# Patient Record
Sex: Female | Born: 1970
Health system: Southern US, Community
[De-identification: ages and names within clinical notes are randomized; demographics above are authoritative.]

## PROBLEM LIST (undated history)

## (undated) DIAGNOSIS — E119 Type 2 diabetes mellitus without complications: Secondary | ICD-10-CM

## (undated) DIAGNOSIS — E785 Hyperlipidemia, unspecified: Secondary | ICD-10-CM

## (undated) DIAGNOSIS — R51 Headache: Secondary | ICD-10-CM

## (undated) DIAGNOSIS — D219 Benign neoplasm of connective and other soft tissue, unspecified: Secondary | ICD-10-CM

## (undated) DIAGNOSIS — D649 Anemia, unspecified: Secondary | ICD-10-CM

## (undated) DIAGNOSIS — E669 Obesity, unspecified: Secondary | ICD-10-CM

## (undated) DIAGNOSIS — I10 Essential (primary) hypertension: Secondary | ICD-10-CM

## (undated) DIAGNOSIS — R011 Cardiac murmur, unspecified: Secondary | ICD-10-CM

## (undated) DIAGNOSIS — Z9289 Personal history of other medical treatment: Secondary | ICD-10-CM

## (undated) HISTORY — PX: TUBAL LIGATION: SHX77

## (undated) HISTORY — DX: Cardiac murmur, unspecified: R01.1

## (undated) HISTORY — DX: Obesity, unspecified: E66.9

## (undated) HISTORY — DX: Hyperlipidemia, unspecified: E78.5

---

## 1998-07-11 ENCOUNTER — Encounter: Payer: Self-pay | Admitting: *Deleted

## 1998-07-11 ENCOUNTER — Emergency Department (HOSPITAL_COMMUNITY): Admission: EM | Admit: 1998-07-11 | Discharge: 1998-07-11 | Payer: Self-pay | Admitting: *Deleted

## 1998-07-25 ENCOUNTER — Emergency Department (HOSPITAL_COMMUNITY): Admission: EM | Admit: 1998-07-25 | Discharge: 1998-07-25 | Payer: Self-pay | Admitting: Emergency Medicine

## 1998-07-25 ENCOUNTER — Encounter: Payer: Self-pay | Admitting: Emergency Medicine

## 1998-10-22 ENCOUNTER — Other Ambulatory Visit: Admission: RE | Admit: 1998-10-22 | Discharge: 1998-10-22 | Payer: Self-pay | Admitting: Obstetrics & Gynecology

## 1999-03-25 ENCOUNTER — Inpatient Hospital Stay (HOSPITAL_COMMUNITY): Admission: AD | Admit: 1999-03-25 | Discharge: 1999-03-27 | Payer: Self-pay | Admitting: Obstetrics & Gynecology

## 2000-09-04 ENCOUNTER — Emergency Department (HOSPITAL_COMMUNITY): Admission: EM | Admit: 2000-09-04 | Discharge: 2000-09-04 | Payer: Self-pay | Admitting: *Deleted

## 2003-01-04 HISTORY — PX: SALPINGECTOMY: SHX328

## 2003-09-09 ENCOUNTER — Ambulatory Visit: Payer: Self-pay | Admitting: Family Medicine

## 2003-11-16 ENCOUNTER — Emergency Department (HOSPITAL_COMMUNITY): Admission: EM | Admit: 2003-11-16 | Discharge: 2003-11-16 | Payer: Self-pay | Admitting: Emergency Medicine

## 2004-01-14 ENCOUNTER — Emergency Department (HOSPITAL_COMMUNITY): Admission: EM | Admit: 2004-01-14 | Discharge: 2004-01-14 | Payer: Self-pay | Admitting: Family Medicine

## 2004-10-16 ENCOUNTER — Ambulatory Visit: Payer: Self-pay | Admitting: Obstetrics & Gynecology

## 2004-10-16 ENCOUNTER — Inpatient Hospital Stay (HOSPITAL_COMMUNITY): Admission: AD | Admit: 2004-10-16 | Discharge: 2004-10-18 | Payer: Self-pay | Admitting: Obstetrics & Gynecology

## 2004-10-16 ENCOUNTER — Encounter: Payer: Self-pay | Admitting: Emergency Medicine

## 2004-10-17 ENCOUNTER — Encounter (INDEPENDENT_AMBULATORY_CARE_PROVIDER_SITE_OTHER): Payer: Self-pay | Admitting: Specialist

## 2004-10-22 ENCOUNTER — Ambulatory Visit: Payer: Self-pay | Admitting: *Deleted

## 2006-02-04 ENCOUNTER — Emergency Department (HOSPITAL_COMMUNITY): Admission: EM | Admit: 2006-02-04 | Discharge: 2006-02-04 | Payer: Self-pay | Admitting: Emergency Medicine

## 2006-03-02 DIAGNOSIS — Z6841 Body Mass Index (BMI) 40.0 and over, adult: Secondary | ICD-10-CM

## 2006-03-02 DIAGNOSIS — G43909 Migraine, unspecified, not intractable, without status migrainosus: Secondary | ICD-10-CM | POA: Insufficient documentation

## 2007-02-26 ENCOUNTER — Emergency Department (HOSPITAL_COMMUNITY): Admission: EM | Admit: 2007-02-26 | Discharge: 2007-02-26 | Payer: Self-pay | Admitting: Family Medicine

## 2008-05-24 ENCOUNTER — Emergency Department (HOSPITAL_COMMUNITY): Admission: EM | Admit: 2008-05-24 | Discharge: 2008-05-24 | Payer: Self-pay | Admitting: Family Medicine

## 2008-08-15 ENCOUNTER — Emergency Department (HOSPITAL_COMMUNITY): Admission: EM | Admit: 2008-08-15 | Discharge: 2008-08-15 | Payer: Self-pay | Admitting: Emergency Medicine

## 2009-05-13 ENCOUNTER — Emergency Department (HOSPITAL_COMMUNITY): Admission: EM | Admit: 2009-05-13 | Discharge: 2009-05-13 | Payer: Self-pay | Admitting: Emergency Medicine

## 2009-05-16 ENCOUNTER — Emergency Department (HOSPITAL_COMMUNITY): Admission: EM | Admit: 2009-05-16 | Discharge: 2009-05-16 | Payer: Self-pay | Admitting: Family Medicine

## 2009-07-31 ENCOUNTER — Emergency Department (HOSPITAL_COMMUNITY): Admission: EM | Admit: 2009-07-31 | Discharge: 2009-07-31 | Payer: Self-pay | Admitting: Emergency Medicine

## 2010-03-20 LAB — URINALYSIS, ROUTINE W REFLEX MICROSCOPIC
Hgb urine dipstick: NEGATIVE
Protein, ur: NEGATIVE mg/dL
Urobilinogen, UA: 0.2 mg/dL (ref 0.0–1.0)

## 2010-03-20 LAB — BASIC METABOLIC PANEL
Calcium: 9.2 mg/dL (ref 8.4–10.5)
Chloride: 107 mEq/L (ref 96–112)
Creatinine, Ser: 0.65 mg/dL (ref 0.4–1.2)
GFR calc Af Amer: >60 mL/min (ref >60)
GFR calc non Af Amer: >60 mL/min (ref >60)

## 2010-03-20 LAB — CBC
Platelets: 347 10*3/uL (ref 150–400)
RBC: 3.97 MIL/uL (ref 3.87–5.11)
WBC: 5.5 10*3/uL (ref 4.0–10.5)

## 2010-03-20 LAB — DIFFERENTIAL
Lymphocytes Relative: 35 % (ref 12–46)
Lymphs Abs: 2 10*3/uL (ref 0.7–4.0)
Neutrophils Relative %: 53 % (ref 43–77)

## 2010-03-20 LAB — WET PREP, GENITAL

## 2010-03-20 LAB — GC/CHLAMYDIA PROBE AMP, GENITAL
Chlamydia, DNA Probe: NEGATIVE
GC Probe Amp, Genital: NEGATIVE

## 2010-03-20 LAB — URINE MICROSCOPIC-ADD ON

## 2010-03-20 LAB — GLUCOSE, CAPILLARY

## 2010-03-20 LAB — PREGNANCY, URINE: Preg Test, Ur: NEGATIVE

## 2010-04-13 LAB — POCT I-STAT, CHEM 8
BUN: 9 mg/dL (ref 6–23)
Creatinine, Ser: 0.7 mg/dL (ref 0.4–1.2)
Glucose, Bld: 213 mg/dL — ABNORMAL HIGH (ref 70–99)
Hemoglobin: 13.3 g/dL (ref 12.0–15.0)
Potassium: 3.8 mEq/L (ref 3.5–5.1)
TCO2: 27 mmol/L (ref 0–100)

## 2010-05-21 NOTE — Discharge Summary (Signed)
[name], [name]                ACCOUNT NO.:  [id number]   MEDICAL RECORD NO.:  [id number]          PATIENT TYPE:  INP   LOCATION:  9319                          FACILITY:  [facility]   PHYSICIAN:  Lesly Dukes, M.D. DATE OF BIRTH:  [birth year]   DATE OF ADMISSION:  10/16/2004  DATE OF DISCHARGE:  10/18/2004                                 DISCHARGE SUMMARY   ADMISSION DIAGNOSIS:  Torsion of ovarian dermoid cyst.   DISCHARGE DIAGNOSES:  Status post exploratory laparotomy and right salpingo-  oophorectomy for right dermoid cyst torsion.   LABORATORY AND X-RAY DATA:  Ultrasound: Right ovarian adnexal mass  consistent with dermoid.   CT could not definitely identify normal peripheral ovarian tissue, Doppler  signal.  CT scan showed a 10 x 7 x 13 cm ovarian keratoma.   Beta HCG negative.  Hemoglobin 10.1 and postoperatively 9.6.   HOSPITAL COURSE:  The patient is a 40 year old who presented with new onset  severe abdominal pain.  The patient was seen by Dr. Henderson Cloud and diagnosed  with ovarian dermoid possible torsion.  The patient was admitted by Dr.  Henderson Cloud and placed on PCA and transferred to Dr. Bertram Denver care in the  morning.  The patient was then taken to the operating room where she  underwent exploratory laparotomy and right salpingo-oophorectomy.  The  patient tolerated the procedure well, and there were no complications.  Please see operative report for full details.   By postop day #1, the patient was voiding, ambulating, and passing gas.  Her  pain was also very well controlled.  She was determined to be stable for  discharge.  Please note that during her hospitalization her blood pressure  ranged with a systolic of 130 to 156 and diastolic of 80 to 98.  She is to  follow this up with her primary care physician.   DISPOSITION:  Home.   DISCHARGE MEDICATIONS:  1.  Percocet 1 or 2 p.o. q.4-6 h.  2.  Colace 100 mg 1 tablet p.o. twice daily p.r.n.   DISCHARGE  INSTRUCTIONS:  1.  The patient is to return if she develops fever, chills, sweats,      increased pain, or bleeding.  2.  Follow up in the gynecology clinic on October 22, 2004, for staple      removal.  3.  Diet: Regular.  4.  Activity: No heavy lifting until notified by physician.     ______________________________  Marc Morgans. Mayford Knife, M.D.    ______________________________  Lesly Dukes, M.D.    TLW/MEDQ  D:  10/18/2004  T:  10/18/2004  Job:  [id number]

## 2010-05-21 NOTE — Op Note (Signed)
NAMEANNABELLE, [name]                ACCOUNT NO.:  [id number]   MEDICAL RECORD NO.:  [id number]          PATIENT TYPE:  INP   LOCATION:  9319                          [facility]  [facility]   PHYSICIAN:  Lesly Dukes, M.D. 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   OPERATION/PROCEDURE:  1.  Exploratory laparotomy.  2.  Right salpingo-oophorectomy.   SURGEON:  Lesly Dukes, M.D.   ASSISTANT:  Guy Sandifer. Henderson Cloud, M.D.   ANESTHESIA:  General.   PATHOLOGY:  Right ovary, right ovarian dermoid and fallopian tube.   ESTIMATED BLOOD LOSS:  Minimal.   COMPLICATIONS:  None.   FINDINGS:  A 10 x 13 mm ovarian dermoid that was consistent with torsion.  Normal appendix. Grossly normal uterus with a small anterior fibroid.  Left  fallopian tube surgically transected after BTL.  Small simple ovarian cyst  of left ovary __________  consistent with pain.   DESCRIPTION OF PROCEDURE:  The patient was taken to the operating room where  general anesthesia was induced.  The patient was placed in the dorsal  lithotomy position and Betadine was placed.  The patient was prepped and  draped in the normal sterile fashion.  Foley was in the bladder.  A  Pfannenstiel skin incision was made with the scalpel and carried down to the  fascia.  The fascia was incised in the midline.  The fascia was tented  bilaterally.  Superior and inferior aspects of the fascial incision were  grasped with clamp, tented up, __________  rectus muscles were separated in  the midline.  Peritoneum was identified and entered bluntly.  This incision  was extended both superiorly and inferiorly.   __________  bladder.  Balfour  retractor was placed in the abdomen and the right adnexa was brought into  surgical view.  The right ovary was torsed and necrotic after identification  of the ureter, the ovarian pedicle was doubly clamped with a Heaney clamp,  transected and suture ligated with 0 Vicryl x2.  Good hemostasis was noted.  Survey of the abdominal cavity revealed the above findings.  The ovarian  pedicle was noted to be hemostatic one last time.  The fascia was closed  with 0 Vicryl in a running fashion.  Good hemostasis was noted.  Was noted.  Incision was copiously irrigated and found to be hemostatic.  Skin was closed with staples.  The patient tolerated the procedure well.  Sponge, lab, instrument, and needle counts were x2. The patient went to the  recovery room in satisfactory condition.           ______________________________  Lesly Dukes, M.D.     KHL/MEDQ  D:  10/17/2004  T:  10/17/2004  Job:  [id number]

## 2010-07-11 ENCOUNTER — Inpatient Hospital Stay (INDEPENDENT_AMBULATORY_CARE_PROVIDER_SITE_OTHER)
Admission: RE | Admit: 2010-07-11 | Discharge: 2010-07-11 | Disposition: A | Discharge status: Home / Self Care | Payer: Self-pay | Source: Ambulatory Visit | Attending: Emergency Medicine | Admitting: Emergency Medicine

## 2010-07-11 DIAGNOSIS — S335XXA Sprain of ligaments of lumbar spine, initial encounter: Secondary | ICD-10-CM

## 2010-07-11 DIAGNOSIS — R112 Nausea with vomiting, unspecified: Secondary | ICD-10-CM

## 2010-07-11 DIAGNOSIS — R070 Pain in throat: Secondary | ICD-10-CM

## 2010-07-11 LAB — POCT URINALYSIS DIP (DEVICE)
Hgb urine dipstick: NEGATIVE
Ketones, ur: NEGATIVE mg/dL
Leukocytes, UA: NEGATIVE
Protein, ur: NEGATIVE mg/dL
pH: 5 (ref 5.0–8.0)

## 2013-02-15 ENCOUNTER — Other Ambulatory Visit: Payer: Self-pay

## 2013-02-15 DIAGNOSIS — Z1231 Encounter for screening mammogram for malignant neoplasm of breast: Secondary | ICD-10-CM

## 2013-02-21 ENCOUNTER — Ambulatory Visit
Admission: RE | Admit: 2013-02-21 | Discharge: 2013-02-21 | Disposition: A | Discharge status: Home / Self Care | Payer: BC Managed Care – PPO | Source: Ambulatory Visit

## 2013-02-21 ENCOUNTER — Ambulatory Visit: Payer: Self-pay

## 2013-02-21 DIAGNOSIS — Z1231 Encounter for screening mammogram for malignant neoplasm of breast: Secondary | ICD-10-CM

## 2013-02-25 ENCOUNTER — Other Ambulatory Visit: Payer: Self-pay | Admitting: Internal Medicine

## 2013-02-25 DIAGNOSIS — R928 Other abnormal and inconclusive findings on diagnostic imaging of breast: Secondary | ICD-10-CM

## 2013-02-26 ENCOUNTER — Other Ambulatory Visit: Payer: Self-pay | Admitting: Internal Medicine

## 2013-02-26 ENCOUNTER — Other Ambulatory Visit: Payer: Self-pay

## 2013-02-26 DIAGNOSIS — R928 Other abnormal and inconclusive findings on diagnostic imaging of breast: Secondary | ICD-10-CM

## 2013-02-28 ENCOUNTER — Other Ambulatory Visit: Payer: BC Managed Care – PPO

## 2013-03-03 DIAGNOSIS — Z9289 Personal history of other medical treatment: Secondary | ICD-10-CM

## 2013-03-03 HISTORY — DX: Personal history of other medical treatment: Z92.89

## 2013-03-04 ENCOUNTER — Ambulatory Visit
Admission: RE | Admit: 2013-03-04 | Discharge: 2013-03-04 | Disposition: A | Discharge status: Home / Self Care | Payer: BC Managed Care – PPO | Source: Ambulatory Visit | Attending: Internal Medicine | Admitting: Internal Medicine

## 2013-03-04 DIAGNOSIS — R928 Other abnormal and inconclusive findings on diagnostic imaging of breast: Secondary | ICD-10-CM

## 2013-03-15 ENCOUNTER — Ambulatory Visit: Payer: Self-pay | Admitting: Certified Nurse Midwife

## 2013-03-20 ENCOUNTER — Observation Stay (HOSPITAL_COMMUNITY)
Admission: AD | Admit: 2013-03-20 | Discharge: 2013-03-21 | Disposition: A | Discharge status: Home / Self Care | Payer: BC Managed Care – PPO | Source: Ambulatory Visit | Attending: Obstetrics and Gynecology | Admitting: Obstetrics and Gynecology

## 2013-03-20 ENCOUNTER — Encounter (HOSPITAL_COMMUNITY): Payer: Self-pay | Admitting: General Practice

## 2013-03-20 DIAGNOSIS — R42 Dizziness and giddiness: Secondary | ICD-10-CM | POA: Insufficient documentation

## 2013-03-20 DIAGNOSIS — N920 Excessive and frequent menstruation with regular cycle: Principal | ICD-10-CM | POA: Insufficient documentation

## 2013-03-20 DIAGNOSIS — E119 Type 2 diabetes mellitus without complications: Secondary | ICD-10-CM | POA: Insufficient documentation

## 2013-03-20 DIAGNOSIS — E669 Obesity, unspecified: Secondary | ICD-10-CM | POA: Insufficient documentation

## 2013-03-20 DIAGNOSIS — D649 Anemia, unspecified: Secondary | ICD-10-CM | POA: Insufficient documentation

## 2013-03-20 DIAGNOSIS — I10 Essential (primary) hypertension: Secondary | ICD-10-CM | POA: Insufficient documentation

## 2013-03-20 HISTORY — DX: Essential (primary) hypertension: I10

## 2013-03-20 HISTORY — DX: Benign neoplasm of connective and other soft tissue, unspecified: D21.9

## 2013-03-20 HISTORY — DX: Anemia, unspecified: D64.9

## 2013-03-20 HISTORY — DX: Type 2 diabetes mellitus without complications: E11.9

## 2013-03-20 LAB — CBC
HCT: 25 % — ABNORMAL LOW (ref 36.0–46.0)
HEMOGLOBIN: 7.7 g/dL — AB (ref 12.0–15.0)
MCH: 22.3 pg — ABNORMAL LOW (ref 26.0–34.0)
MCHC: 30.8 g/dL (ref 30.0–36.0)
MCV: 72.3 fL — ABNORMAL LOW (ref 78.0–100.0)
Platelets: 330 10*3/uL (ref 150–400)
RBC: 3.46 MIL/uL — ABNORMAL LOW (ref 3.87–5.11)
RDW: 24.1 % — AB (ref 11.5–15.5)
WBC: 8.1 10*3/uL (ref 4.0–10.5)

## 2013-03-20 LAB — PREGNANCY, URINE: Preg Test, Ur: NEGATIVE

## 2013-03-20 LAB — ABO/RH: ABO/RH(D): A POS

## 2013-03-20 LAB — PREPARE RBC (CROSSMATCH)

## 2013-03-20 MED ORDER — MEDROXYPROGESTERONE ACETATE 10 MG PO TABS
10.0000 mg | ORAL_TABLET | Freq: Every day | ORAL | Status: DC
  Administered 2013-03-20 – 2013-03-21 (×2): 10 mg via ORAL
  Filled 2013-03-20 (×3): qty 1

## 2013-03-20 MED ORDER — SODIUM CHLORIDE 0.9 % IV SOLN
INTRAVENOUS | Status: DC
  Administered 2013-03-20 – 2013-03-21 (×3): via INTRAVENOUS

## 2013-03-20 MED ORDER — IBUPROFEN 600 MG PO TABS: 600.0000 mg | ORAL_TABLET | Freq: Four times a day (QID) | ORAL | Status: DC | PRN

## 2013-03-20 MED ORDER — KETOROLAC TROMETHAMINE 60 MG/2ML IM SOLN
60.0000 mg | Freq: Once | INTRAMUSCULAR | Status: AC
  Administered 2013-03-20: 60 mg via INTRAMUSCULAR
  Filled 2013-03-20: qty 2

## 2013-03-20 MED ORDER — FERROUS SULFATE 325 (65 FE) MG PO TABS
325.0000 mg | ORAL_TABLET | Freq: Two times a day (BID) | ORAL | Status: DC
  Administered 2013-03-20 – 2013-03-21 (×2): 325 mg via ORAL
  Filled 2013-03-20 (×4): qty 1

## 2013-03-20 NOTE — MAU Provider Note (Signed)
History     CSN: [id number]  Arrival date and time: 03/20/13 1050   None     Chief Complaint  Patient presents with  . Vaginal Bleeding  . Dizziness  . Shortness of Breath   HPI Pt presents with heavy VB and dizziness. Pt started her period yesterday. She has soaked a pad q 45 minutes.  She denies having any bleeding disorders.  Her periods have been heavy over the last year.    OB History   Grav Para Term Preterm Abortions TAB SAB Ect Mult Living   3 3        3       Past Medical History  Diagnosis Date  . Fibroids   . Diabetes mellitus without complication   . Hypertension   . Anemia     Past Surgical History  Procedure Laterality Date  . Salpingectomy Right 2005    History reviewed. No pertinent family history.  History  Substance Use Topics  . Smoking status: Never Smoker   . Smokeless tobacco: Never Used  . Alcohol Use: Yes     Comment: very occasional    Allergies: No Known Allergies  Prescriptions prior to admission  Medication Sig Dispense Refill  . Feeding Supplies (ENFAMIL SLOW-FLOW NIPPLE) MISC 3 capsules by Does not apply route 2 (two) times daily.      . ferrous sulfate 325 (65 FE) MG tablet Take 325 mg by mouth daily with breakfast.      . ibuprofen (ADVIL,MOTRIN) 200 MG tablet Take 200 mg by mouth every 6 (six) hours as needed.      Marland Kitchen lisinopril-hydrochlorothiazide (PRINZIDE,ZESTORETIC) 10-12.5 MG per tablet Take 1 tablet by mouth daily.      . metFORMIN (GLUCOPHAGE) 500 MG tablet Take 500 mg by mouth 2 (two) times daily with a meal.        ROS Physical Exam   Blood pressure 77/51, pulse 91, temperature 97.9 F (36.6 C), temperature source Oral, resp. rate 20, height 5\' 3"  (1.6 m), weight 107.684 kg (237 lb 6.4 oz), last menstrual period 03/19/2013, SpO2 100.00%.  Physical Exam Physical Examination: General appearance - alert, well appearing, and in no distress Chest - clear to auscultation, no wheezes, rales or rhonchi, symmetric air  entry Heart - normal rate and regular rhythm Abdomen - soft, nontender, nondistended, no masses or organomegaly Pelvic - normal external genitalia, vulva, vagina, cervix, uterus and adnexa, large clots and heavy bleeding seen Extremities - peripheral pulses normal, no pedal edema, no clubbing or cyanosis   MAU Course  Procedures  MDM Recent Results (from the past 2160 hour(s))  CBC     Status: Abnormal   Collection Time    03/20/13 12:50 PM      Result Value Ref Range   WBC 8.1  4.0 - 10.5 K/uL   RBC 3.46 (*) 3.87 - 5.11 MIL/uL   Hemoglobin 7.7 (*) 12.0 - 15.0 g/dL   HCT 25.0 (*) 36.0 - 46.0 %   MCV 72.3 (*) 78.0 - 100.0 fL   MCH 22.3 (*) 26.0 - 34.0 pg   MCHC 30.8  30.0 - 36.0 g/dL   RDW 24.1 (*) 11.5 - 15.5 %   Platelets 330  150 - 400 K/uL  TYPE AND SCREEN     Status: None   Collection Time    03/20/13 12:50 PM      Result Value Ref Range   ABO/RH(D) A POS     Antibody Screen NEG  Sample Expiration 03/23/2013      Assessment and Plan  Menorrhagia with symptomatic anemia Will place in observation and give blood because she is symptomatic Provera and toradol to help decrease the amount of bleeding Plan to fu in the office for [facility] and [facility] Pt agrees with the plan  [place] A 03/20/2013, 3:01 PM

## 2013-03-20 NOTE — MAU Note (Signed)
Pt has hx of fibroids.

## 2013-03-20 NOTE — MAU Note (Signed)
Period started yesterday, bleeding heavily, soaking super pads every hour.  Feeling lightheaded & SOB.  Lower abd cramping & pain @ her cervix.

## 2013-03-21 DIAGNOSIS — N920 Excessive and frequent menstruation with regular cycle: Secondary | ICD-10-CM | POA: Diagnosis present

## 2013-03-21 LAB — CBC
HEMATOCRIT: 23.9 % — AB (ref 36.0–46.0)
HEMOGLOBIN: 7.7 g/dL — AB (ref 12.0–15.0)
MCH: 24.4 pg — ABNORMAL LOW (ref 26.0–34.0)
MCHC: 32.2 g/dL (ref 30.0–36.0)
MCV: 75.6 fL — ABNORMAL LOW (ref 78.0–100.0)
Platelets: 223 10*3/uL (ref 150–400)
RBC: 3.16 MIL/uL — AB (ref 3.87–5.11)
RDW: 21.3 % — ABNORMAL HIGH (ref 11.5–15.5)
WBC: 11.9 10*3/uL — AB (ref 4.0–10.5)

## 2013-03-21 LAB — TYPE AND SCREEN
ABO/RH(D): A POS
ANTIBODY SCREEN: NEGATIVE
UNIT DIVISION: 0
Unit division: 0

## 2013-03-21 MED ORDER — MEDROXYPROGESTERONE ACETATE 10 MG PO TABS: 10.0000 mg | ORAL_TABLET | Freq: Every day | ORAL | Status: DC

## 2013-03-21 NOTE — Progress Notes (Signed)
Patient changed pad x 1 since 7:30pm with scant amount vaginal drainage noted.  No clots.

## 2013-03-21 NOTE — Discharge Instructions (Signed)
Menorrhagia Menorrhagia is the medical term for when your menstrual periods are heavy or last longer than usual. With menorrhagia, every period you have may cause enough blood loss and cramping that you are unable to maintain your usual activities. CAUSES  In some cases, the cause of heavy periods is unknown, but a number of conditions may cause menorrhagia. Common causes include:  A problem with the hormone-producing thyroid gland (hypothyroid).  Noncancerous growths in the uterus (polyps or fibroids).  An imbalance of the estrogen and progesterone hormones.  One of your ovaries not releasing an egg during one or more months.  Side effects of having an intrauterine device (IUD).  Side effects of some medicines, such as anti-inflammatory medicines or blood thinners.  A bleeding disorder that stops your blood from clotting normally. SIGNS AND SYMPTOMS  During a normal period, bleeding lasts between 4 and 8 days. Signs that your periods are too heavy include:  You routinely have to change your pad or tampon every 1 or 2 hours because it is completely soaked.  You pass blood clots larger than 1 inch (2.5 cm) in size.  You have bleeding for more than 7 days.  You need to use pads and tampons at the same time because of heavy bleeding.  You need to wake up to change your pads or tampons during the night.  You have symptoms of anemia, such as tiredness, fatigue, or shortness of breath. DIAGNOSIS  Your health care provider will perform a physical exam and ask you questions about your symptoms and menstrual history. Other tests may be ordered based on what the health care provider finds during the exam. These tests can include:  Blood tests To check if you are pregnant or have hormonal changes, a bleeding or thyroid disorder, low iron levels (anemia), or other problems.  Endometrial biopsy Your health care provider takes a sample of tissue from the inside of your uterus to be examined  under a microscope.  Pelvic ultrasound This test uses sound waves to make a picture of your uterus, ovaries, and vagina. The pictures can show if you have fibroids or other growths.  Hysteroscopy For this test, your health care provider will use a small telescope to look inside your uterus. Based on the results of your initial tests, your health care provider may recommend further testing. TREATMENT  Treatment may not be needed. If it is needed, your health care provider may recommend treatment with one or more medicines first. If these do not reduce bleeding enough, a surgical treatment might be an option. The best treatment for you will depend on:   Whether you need to prevent pregnancy.  Your desire to have children in the future.  The cause and severity of your bleeding.  Your opinion and personal preference.  Medicines for menorrhagia may include:  Birth control methods that use hormones These include the pill, skin patch, vaginal ring, shots that you get every 3 months, hormonal IUD, and implant. These treatments reduce bleeding during your menstrual period.  Medicines that thicken blood and slow bleeding.  Medicines that reduce swelling, such as ibuprofen.  Medicines that contain a synthetic hormone called progestin.   Medicines that make the ovaries stop working for a short time.  You may need surgical treatment for menorrhagia if the medicines are unsuccessful. Treatment options include:  Dilation and curettage (D&C) In this procedure, your health care provider opens (dilates) your cervix and then scrapes or suctions tissue from the lining of your   uterus to reduce menstrual bleeding.  Operative hysteroscopy This procedure uses a tiny tube with a light (hysteroscope) to view your uterine cavity and can help in the surgical removal of a polyp that may be causing heavy periods.  Endometrial ablation Through various techniques, your health care provider permanently  destroys the entire lining of your uterus (endometrium). After endometrial ablation, most women have little or no menstrual flow. Endometrial ablation reduces your ability to become pregnant.  Endometrial resection This surgical procedure uses an electrosurgical wire loop to remove the lining of the uterus. This procedure also reduces your ability to become pregnant.  Hysterectomy Surgical removal of the uterus and cervix is a permanent procedure that stops menstrual periods. Pregnancy is not possible after a hysterectomy. This procedure requires anesthesia and hospitalization. HOME CARE INSTRUCTIONS   Only take over-the-counter or prescription medicines as directed by your health care provider. Take prescribed medicines exactly as directed. Do not change or switch medicines without consulting your health care provider.  Take any prescribed iron pills exactly as directed by your health care provider. Long-term heavy bleeding may result in low iron levels. Iron pills help replace the iron your body lost from heavy bleeding. Iron may cause constipation. If this becomes a problem, increase the bran, fruits, and roughage in your diet.  Do not take aspirin or medicines that contain aspirin 1 week before or during your menstrual period. Aspirin may make the bleeding worse.  If you need to change your sanitary pad or tampon more than once every 2 hours, stay in bed and rest as much as possible until the bleeding stops.  Eat well-balanced meals. Eat foods high in iron. Examples are leafy green vegetables, meat, liver, eggs, and whole grain breads and cereals. Do not try to lose weight until the abnormal bleeding has stopped and your blood iron level is back to normal. SEEK MEDICAL CARE IF:   You soak through a pad or tampon every 1 or 2 hours, and this happens every time you have a period.  You need to use pads and tampons at the same time because you are bleeding so much.  You need to change your pad  or tampon during the night.  You have a period that lasts for more than 8 days.  You pass clots bigger than 1 inch wide.  You have irregular periods that happen more or less often than once a month.  You feel dizzy or faint.  You feel very weak or tired.  You feel short of breath or feel your heart is beating too fast when you exercise.  You have nausea and vomiting or diarrhea while you are taking your medicine.  You have any problems that may be related to the medicine you are taking. SEEK IMMEDIATE MEDICAL CARE IF:   You soak through 4 or more pads or tampons in 2 hours.  You have any bleeding while you are pregnant. MAKE SURE YOU:   Understand these instructions.  Will watch your condition.  Will get help right away if you are not doing well or get worse. Document Released: 12/20/2004 Document Revised: 10/10/2012 Document Reviewed: 06/10/2012 [facility] Patient Information 2014 [place]. Anemia, Nonspecific Anemia is a condition in which the concentration of red blood cells or hemoglobin in the blood is below normal. Hemoglobin is a substance in red blood cells that carries oxygen to the tissues of the body. Anemia results in not enough oxygen reaching these tissues.  CAUSES  Common causes of  anemia include:   Excessive bleeding. Bleeding may be internal or external. This includes excessive bleeding from periods (in women) or from the intestine.   Poor nutrition.   Chronic kidney, thyroid, and liver disease.  Bone marrow disorders that decrease red blood cell production.  Cancer and treatments for cancer.  HIV, AIDS, and their treatments.  Spleen problems that increase red blood cell destruction.  Blood disorders.  Excess destruction of red blood cells due to infection, medicines, and autoimmune disorders. SIGNS AND SYMPTOMS   Minor weakness.   Dizziness.   Headache.  Palpitations.   Shortness of breath, especially with exercise.    Paleness.  Cold sensitivity.  Indigestion.  Nausea.  Difficulty sleeping.  Difficulty concentrating. Symptoms may occur suddenly or they may develop slowly.  DIAGNOSIS  Additional blood tests are often needed. These help your health care provider determine the best treatment. Your health care provider will check your stool for blood and look for other causes of blood loss.  TREATMENT  Treatment varies depending on the cause of the anemia. Treatment can include:   Supplements of iron, vitamin T55, or folic acid.   Hormone medicines.   A blood transfusion. This may be needed if blood loss is severe.   Hospitalization. This may be needed if there is significant continual blood loss.   Dietary changes.  Spleen removal. HOME CARE INSTRUCTIONS Keep all follow-up appointments. It often takes many weeks to correct anemia, and having your health care provider check on your condition and your response to treatment is very important. SEEK IMMEDIATE MEDICAL CARE IF:   You develop extreme weakness, shortness of breath, or chest pain.   You become dizzy or have trouble concentrating.  You develop heavy vaginal bleeding.   You develop a rash.   You have bloody or black, tarry stools.   You faint.   You vomit up blood.   You vomit repeatedly.   You have abdominal pain.  You have a fever or persistent symptoms for more than 2 3 days.   You have a fever and your symptoms suddenly get worse.   You are dehydrated.  MAKE SURE YOU:  Understand these instructions.  Will watch your condition.  Will get help right away if you are not doing well or get worse. Document Released: 01/28/2004 Document Revised: 08/22/2012 Document Reviewed: 06/15/2012 [facility] Patient Information 2014 [place].

## 2013-03-21 NOTE — Discharge Summary (Signed)
Physician Discharge Summary  Patient ID: [name] MRN: [id number] DOB/AGE: 45-Sep-1972 45 y.o.  Admit date:         03/20/2013 Discharge date: 03/21/2013  Admission Diagnoses:  Menorrhagia  Dizziness  Anemia  Diabetes  Hypertension  Obesity  Discharge Diagnoses:   Same  Procedures this Admission:  03/20/2013  Blood transfusion  Discharged Condition:   good   Admission Hx and PE:  The patient has been followed at the [facility]. She has a history of  fibroids, anemia, and menorrhagia. Her menstrual cycle started on 03/19/2013. Her bleeding has been heavier than usual. The patient complains of dizziness.  Please see her documented history and physical exam.   Hospital course:  On the day of admission, the patient underwent the following: Blood transfusion. She was given Provera and Toradol. She was given boluses of IV fluid. She felt much better after her treatment. On the day of discharge the patient was able to ambulate without difficulty. She tolerated a regular diet. Today she was felt to be ready for discharge. Her hemoglobin was noted to be 7.7 on admission.  Labs:  Results for orders placed during the hospital encounter of 03/20/13 (from the past 24 hour(s))  PREPARE RBC (CROSSMATCH)     Status: None   Collection Time    03/20/13  2:58 PM      Result Value Ref Range   Order Confirmation ORDER PROCESSED BY BLOOD BANK    PREGNANCY, URINE     Status: None   Collection Time    03/20/13 10:50 PM      Result Value Ref Range   Preg Test, Ur NEGATIVE  NEGATIVE  CBC     Status: Abnormal   Collection Time    03/21/13  2:15 AM      Result Value Ref Range   WBC 11.9 (*) 4.0 - 10.5 K/uL   RBC 3.16 (*) 3.87 - 5.11 MIL/uL   Hemoglobin 7.7 (*) 12.0 - 15.0 g/dL   HCT 23.9 (*) 36.0 - 46.0 %   MCV 75.6 (*) 78.0 - 100.0 fL   MCH 24.4 (*) 26.0 - 34.0 pg   MCHC 32.2  30.0 - 36.0 g/dL   RDW 21.3 (*) 11.5 - 15.5  %   Platelets 223  150 - 400 K/uL     Hemoglobin  Date Value Ref Range Status  03/21/2013 7.7* 12.0 - 15.0 g/dL Final     HCT  Date Value Ref Range Status  03/21/2013 23.9* 36.0 - 46.0 % Final    Consults: None  Disposition:  The patient will be discharged to home. She has been given a copy of the discharge instructions as prepared by the [facility] for patients who have menorrhagia and anemia .      Medication List    STOP taking these medications       ENFAMIL SLOW-FLOW NIPPLE Misc      TAKE these medications       ferrous sulfate 325 (65 FE) MG tablet  Take 325 mg by mouth daily with breakfast.     ibuprofen 200 MG tablet  Commonly known as:  ADVIL,MOTRIN  Take 200 mg by mouth every 6 (six) hours as needed.     lisinopril-hydrochlorothiazide 10-12.5 MG per tablet  Commonly known as:  PRINZIDE,ZESTORETIC  Take 1 tablet by mouth daily.     medroxyPROGESTERone 10 MG tablet  Commonly known as:  PROVERA  Take 1 tablet (10 mg total) by mouth daily.     metFORMIN 500 MG tablet  Commonly known as:  GLUCOPHAGE  Take 500 mg by mouth 2 (two) times daily with a meal.           Follow-up Information   Follow up with [facility] A, MD In 1 week.   Specialty:  Obstetrics and Gynecology   Contact information:   [address] [place] [address] [phone]       Signed: Eli Hose 03/21/2013, 2:49 PM

## 2013-03-21 NOTE — Progress Notes (Signed)
Pt discharged home with son... Condition stable... No equipment... Ambulated to car with E. Mandrell Vangilder, RN.  

## 2013-03-22 NOTE — H&P (Signed)
CSN: [id number]  Arrival date and time: 03/20/13 1050  None  Chief Complaint   Patient presents with   .  Vaginal Bleeding   .  Dizziness   .  Shortness of Breath    HPI Pt presents with heavy VB and dizziness. Pt started her period yesterday. She has soaked a pad q 45 minutes. She denies having any bleeding disorders. Her periods have been heavy over the last year.  OB History    Grav  Para  Term  Preterm  Abortions  TAB  SAB  Ect  Mult  Living    3  3         3       Past Medical History   Diagnosis  Date   .  Fibroids    .  Diabetes mellitus without complication    .  Hypertension    .  Anemia     Past Surgical History   Procedure  Laterality  Date   .  Salpingectomy  Right  2005    History reviewed. No pertinent family history.  History   Substance Use Topics   .  Smoking status:  Never Smoker   .  Smokeless tobacco:  Never Used   .  Alcohol Use:  Yes      Comment: very occasional    Allergies: No Known Allergies  Prescriptions prior to admission   Medication  Sig  Dispense  Refill   .  Feeding Supplies (ENFAMIL SLOW-FLOW NIPPLE) MISC  3 capsules by Does not apply route 2 (two) times daily.     .  ferrous sulfate 325 (65 FE) MG tablet  Take 325 mg by mouth daily with breakfast.     .  ibuprofen (ADVIL,MOTRIN) 200 MG tablet  Take 200 mg by mouth every 6 (six) hours as needed.     Marland Kitchen  lisinopril-hydrochlorothiazide (PRINZIDE,ZESTORETIC) 10-12.5 MG per tablet  Take 1 tablet by mouth daily.     .  metFORMIN (GLUCOPHAGE) 500 MG tablet  Take 500 mg by mouth 2 (two) times daily with a meal.      ROS  Physical Exam   Blood pressure 77/51, pulse 91, temperature 97.9 F (36.6 C), temperature source Oral, resp. rate 20, height 5\' 3"  (1.6 m), weight 107.684 kg (237 lb 6.4 oz), last menstrual period 03/19/2013, SpO2 100.00%.  Physical Exam  Physical Examination: General appearance - alert, well appearing, and in no distress  Chest - clear to auscultation, no wheezes, rales or  rhonchi, symmetric air entry  Heart - normal rate and regular rhythm  Abdomen - soft, nontender, nondistended, no masses or organomegaly  Pelvic - normal external genitalia, vulva, vagina, cervix, uterus and adnexa, large clots and heavy bleeding seen  Extremities - peripheral pulses normal, no pedal edema, no clubbing or cyanosis  MAU Course   Procedures  MDM  Recent Results (from the past 2160 hour(s))   CBC Status: Abnormal    Collection Time    03/20/13 12:50 PM   Result  Value  Ref Range    WBC  8.1  4.0 - 10.5 K/uL    RBC  3.46 (*)  3.87 - 5.11 MIL/uL    Hemoglobin  7.7 (*)  12.0 - 15.0 g/dL    HCT  25.0 (*)  36.0 - 46.0 %    MCV  72.3 (*)  78.0 - 100.0 fL    MCH  22.3 (*)  26.0 - 34.0 pg    MCHC  30.8  30.0 - 36.0 g/dL    RDW  24.1 (*)  11.5 - 15.5 %    Platelets  330  150 - 400 K/uL   TYPE AND SCREEN Status: None    Collection Time    03/20/13 12:50 PM   Result  Value  Ref Range    ABO/RH(D)  A POS     Antibody Screen  NEG     Sample Expiration  03/23/2013     Assessment and Plan   Menorrhagia with symptomatic anemia  Will place in observation and give blood because she is symptomatic  Provera and toradol to help decrease the amount of bleeding  Plan to fu in the office for [facility] and [facility]  Pt agrees with the plan  [place] A  03/20/2013, 3:01 PM

## 2013-05-03 ENCOUNTER — Other Ambulatory Visit: Payer: Self-pay | Admitting: Obstetrics and Gynecology

## 2013-05-08 ENCOUNTER — Other Ambulatory Visit: Payer: Self-pay

## 2013-05-08 ENCOUNTER — Other Ambulatory Visit (HOSPITAL_COMMUNITY): Payer: Self-pay | Admitting: Obstetrics and Gynecology

## 2013-05-08 NOTE — H&P (Signed)
[name] is a 43 y.o.  female P 3-0-0-3 presents for hysterectomy because of menorrhagia, anemia  and fibroids.  For the past several years the patient has had increasing length and volume of her menses but fortunately no cramping.  Her menstrual flow lasts for 7-12 days, accompanied by clots requiring her to change her pad every 30-45 minutes.  She denies any changes in urinary/bowel function and no dyspareunia or vaginitis symptoms.  In March 2015 she was transfused 2 units of packed red blood cells due to symptomatic  anemia attributed to her menorrhagia.  Her hemoglobin/hematocrit was 7.7/25.0 respectively though her TSH was normal and gonorrhea/chlyamydia tests negative.   A SHG in March 2015 showed: uterus: 8.29 x 6.64 x 6.59 cm with multiple fibroids;   #3 intramural fibroids- 2.6 x 2.8 x 2.4 cm;   2.9 x 3.0 x 2.8 cm with a submucosal component and 2.9 x 2.9 x 2.7 cm.  The saline infusion showed a sub-mucosal fibroid in the lower 1/3 of endometrial cavity 2.7 c 1.8 cm.   Since her transfusion in March, attempts were made to control her bleeding with Provera 20 mg daily that initially worked but then failed.  She was subsequently placed on Aygestin 10 mg daily that also curtailed her bleeding initially, however, the effect was short-lived.   A review of both medical and surgical management options were given to the patient however, given the debilitating nature of her symptoms, along with radiographic findings,  she has decided to proceed with definitive therapy in the form of hysterectomy.   Past Medical History  OB History: G3;  P 3-0-03;   SVB 1989, 1991 and 2001   GYN History: menarche: 43 YO;     LMP: 04/30/13;     Contracepton: Tubal Sterilization; Admits to Herpes Simplex 2 but  denies history of abnormal PAP smear;   Last PAP smear: 2015  Medical History: Hypertension, Diabetes Mellitus, Anemia, Menstrual Headaches  Surgical History:  2001  Tubal Sterilzation   2005 Right  Salpingo-oophorectomy (for ovarian cyst)   2014 Left Eye Chalzion Removal Denies problems with anesthesia;  was transfused 2 units of packed red blood cells March 2015  Family History: Hypertension and Diabetes Mellitus  Social History: Divorced and employed as a Psychologist, sport and exercise;  Denies tobacco use but occasionally uses alcohol   Medications:  Metformin  500 mg daily Aygestin 5 mg  2 daily FeSO4  325 mg 2 daily Lisinopril 10 mg  daily   Denies sensitivity to peanuts, shellfish, soy, latex or adhesives.   No Known Allergies  ROS: Admits to menstrual headaches;   Denies  vision changes, nasal congestion, dysphagia, tinnitus, dizziness, hoarseness, cough,  chest pain, shortness of breath, nausea, vomiting, diarrhea,constipation,  urinary frequency, urgency  dysuria, hematuria, vaginitis symptoms, pelvic pain, swelling of joints,easy bruising,  myalgias, arthralgias, skin rashes, unexplained weight loss and except as is mentioned in the history of present illness, patient's review of systems is otherwise negative.   Physical Exam  Bp: 122/84   P: 60    R: 15   Temperature: 98.5 degrees F orally   Weight: 240 lbs.  Height: 5\' 3"   BMI: 42.5  Neck: supple without masses or thyromegaly Lungs: clear to auscultation Heart: regular rate and rhythm Abdomen: soft, non-tender and no organomegaly Pelvic:EGBUS- wnl; vagina-normal rugae, large blood in vault;  uterus-appears normal size though exam limited by habitus, cervix without lesions or motion tenderness though exam limited by habitus;  adnexae-no tenderness  or masses Extremities:  no clubbing, cyanosis or edema  Endometrial Biopsy-abundant blood and benign weakly proliferative endometrium, no atypia, hyperplasia or malignancy.  Assesment:  Menorrhagia            Anemia            Submucosal Fibroid   Disposition:  A discussion was held with patient regarding the indication for her procedure(s) along with the risks, which include  but are not limited to: reaction to anesthesia, damage to adjacent organs, infection and excessive bleeding.  The patient was given a Miralax bowel prep to be completed the day before her surgery. She  verbalized understanding of her preoperative instructions and the risks associated with her surgery and has consented to proceed with a Total Laparoscopic Hysterectomy with Bilateral Salpingectomy, Possible Laparoscopically Assisted Vaginal Hysterectomy, Possible Total Abdominal Hysterectomy and Cystoscopy at [facility] on May 22, 2013 at 1:30 p.m.   CSN# [id number]   Maicy Filip J. Florene Glen, PA-C  for Dr. Franklyn Lor. Dillard

## 2013-05-09 ENCOUNTER — Encounter (HOSPITAL_COMMUNITY): Payer: Self-pay

## 2013-05-16 NOTE — Patient Instructions (Addendum)
   Your procedure is scheduled on:  Wednesday, May 20  Enter through the [employer] [facility] at: [place] up the phone at the desk and dial [phone] and inform us of your arrival.  Please call this number if you have any problems the morning of surgery: [phone]  Remember: Do not eat food after midnight: Tuesday Do not drink clear liquids after: 9 AM Wednesday, day of surgery Take these medicines the morning of surgery with a SIP OF WATER: lisinopril-hctz.  Patient instructed to withhold Tuesday's night dose and Wednesday morning dose of metformin.  We will check your blood sugar when you arrive in Short Stay on day of surgery.  Do not wear jewelry, make-up, or FINGER nail polish No metal in your hair or on your body. Do not wear lotions, powders, perfumes.  You may wear deodorant.  Do not bring valuables to the hospital. Contacts, dentures or bridgework may not be worn into surgery.  Leave suitcase in the car. After Surgery it may be brought to your room. For patients being admitted to the hospital, checkout time is 11:00am the day of discharge.  Home with sister [name] cell [phone].

## 2013-05-17 ENCOUNTER — Encounter (HOSPITAL_COMMUNITY): Payer: Self-pay

## 2013-05-17 ENCOUNTER — Encounter (HOSPITAL_COMMUNITY)
Admission: RE | Admit: 2013-05-17 | Discharge: 2013-05-17 | Disposition: A | Discharge status: Home / Self Care | Payer: BC Managed Care – PPO | Source: Ambulatory Visit | Attending: Obstetrics and Gynecology | Admitting: Obstetrics and Gynecology

## 2013-05-17 DIAGNOSIS — Z01812 Encounter for preprocedural laboratory examination: Secondary | ICD-10-CM | POA: Insufficient documentation

## 2013-05-17 DIAGNOSIS — Z01810 Encounter for preprocedural cardiovascular examination: Secondary | ICD-10-CM | POA: Insufficient documentation

## 2013-05-17 HISTORY — DX: Personal history of other medical treatment: Z92.89

## 2013-05-17 HISTORY — DX: Headache: R51

## 2013-05-17 LAB — CBC
HCT: 26.2 % — ABNORMAL LOW (ref 36.0–46.0)
HEMOGLOBIN: 7.9 g/dL — AB (ref 12.0–15.0)
MCH: 25 pg — ABNORMAL LOW (ref 26.0–34.0)
MCHC: 30.2 g/dL (ref 30.0–36.0)
MCV: 82.9 fL (ref 78.0–100.0)
PLATELETS: 454 10*3/uL — AB (ref 150–400)
RBC: 3.16 MIL/uL — AB (ref 3.87–5.11)
RDW: 17.6 % — ABNORMAL HIGH (ref 11.5–15.5)
WBC: 10.5 10*3/uL (ref 4.0–10.5)

## 2013-05-17 LAB — COMPREHENSIVE METABOLIC PANEL
ALT: 9 U/L (ref 0–35)
AST: 8 U/L (ref 0–37)
Albumin: 3.5 g/dL (ref 3.5–5.2)
Alkaline Phosphatase: 52 U/L (ref 39–117)
BUN: 10 mg/dL (ref 6–23)
CHLORIDE: 98 meq/L (ref 96–112)
CO2: 24 meq/L (ref 19–32)
CREATININE: 0.69 mg/dL (ref 0.50–1.10)
Calcium: 9.3 mg/dL (ref 8.4–10.5)
GFR calc Af Amer: >90 mL/min (ref >90)
GFR calc non Af Amer: >90 mL/min (ref >90)
Glucose, Bld: 304 mg/dL — ABNORMAL HIGH (ref 70–99)
Potassium: 4.3 mEq/L (ref 3.7–5.3)
Sodium: 133 mEq/L — ABNORMAL LOW (ref 137–147)
Total Protein: 7.3 g/dL (ref 6.0–8.3)

## 2013-05-17 LAB — TYPE AND SCREEN
ABO/RH(D): A POS
Antibody Screen: NEGATIVE

## 2013-05-17 NOTE — Pre-Procedure Instructions (Signed)
Dr Primitivo Gauze informed hgb 7.9 and glucose 304 at PAT appt today.  MD has ordered 2 units of RBCs.  T & S done today.  Patient is on iron.  Dr Primitivo Gauze said patient needs to follow up with PCP to get glucose level down below 300 on day of surgery.  Patient and MD informed.  Patient states she will follow up with PCP today.

## 2013-05-17 NOTE — Pre-Procedure Instructions (Signed)
SDS BB History Log given to Lab for patient's history of blood transfusion in 03/2013 at [facility] - 2 units transfused.

## 2013-05-18 ENCOUNTER — Emergency Department (HOSPITAL_COMMUNITY)
Admission: EM | Admit: 2013-05-18 | Discharge: 2013-05-18 | Disposition: A | Discharge status: Home / Self Care | Payer: BC Managed Care – PPO | Source: Home / Self Care | Attending: Emergency Medicine | Admitting: Emergency Medicine

## 2013-05-18 ENCOUNTER — Encounter (HOSPITAL_COMMUNITY): Payer: Self-pay | Admitting: Emergency Medicine

## 2013-05-18 DIAGNOSIS — R739 Hyperglycemia, unspecified: Secondary | ICD-10-CM

## 2013-05-18 DIAGNOSIS — R7309 Other abnormal glucose: Secondary | ICD-10-CM

## 2013-05-18 LAB — POCT URINALYSIS DIP (DEVICE)
BILIRUBIN URINE: NEGATIVE
Glucose, UA: >=1000 mg/dL — AB
KETONES UR: NEGATIVE mg/dL
Leukocytes, UA: NEGATIVE
Nitrite: NEGATIVE
PH: 6 (ref 5.0–8.0)
Protein, ur: 30 mg/dL — AB
SPECIFIC GRAVITY, URINE: 1.02 (ref 1.005–1.030)
Urobilinogen, UA: 0.2 mg/dL (ref 0.0–1.0)

## 2013-05-18 LAB — POCT I-STAT, CHEM 8
BUN: 9 mg/dL (ref 6–23)
CREATININE: 0.7 mg/dL (ref 0.50–1.10)
Calcium, Ion: 1.22 mmol/L (ref 1.12–1.23)
Chloride: 99 mEq/L (ref 96–112)
Glucose, Bld: 315 mg/dL — ABNORMAL HIGH (ref 70–99)
HCT: 30 % — ABNORMAL LOW (ref 36.0–46.0)
Hemoglobin: 10.2 g/dL — ABNORMAL LOW (ref 12.0–15.0)
POTASSIUM: 4.4 meq/L (ref 3.7–5.3)
SODIUM: 134 meq/L — AB (ref 137–147)
TCO2: 23 mmol/L (ref 0–100)

## 2013-05-18 MED ORDER — METFORMIN HCL 850 MG PO TABS: 850.0000 mg | ORAL_TABLET | Freq: Two times a day (BID) | ORAL | Status: DC

## 2013-05-18 MED ORDER — ONDANSETRON 4 MG PO TBDP
ORAL_TABLET | ORAL | Status: AC
  Filled 2013-05-18: qty 1

## 2013-05-18 MED ORDER — ONDANSETRON 4 MG PO TBDP
4.0000 mg | ORAL_TABLET | Freq: Once | ORAL | Status: AC
  Administered 2013-05-18: 4 mg via ORAL

## 2013-05-18 NOTE — ED Provider Notes (Signed)
CSN: [id number]     Arrival date & time 05/18/13  4098 History   First MD Initiated Contact with Patient 05/18/13 1001     Chief Complaint  Patient presents with  . Hyperglycemia   (Consider location/radiation/quality/duration/timing/severity/associated sxs/prior Treatment) HPI Comments: 43 year old female with history of type 2 diabetes, iron deficiency anemia, presents complaining of hyperglycemia and nausea. This was noted on her preop labs done yesterday for hysterectomy. She was told to followup with primary care. She called and spoke with her primary care physician, but their office never called her back. She checked her blood sugar and it was even higher at 324 this morning. She believes that this may be due to her Provera and her norethindrone that were both started recently. She currently takes metformin 500 mg twice a day.she is a has nausea with no vomiting or abdominal pain. Denies polyuria or polydipsia.   Patient is a 43 y.o. female presenting with hyperglycemia.  Hyperglycemia Associated symptoms: nausea     Past Medical History  Diagnosis Date  . Fibroids   . Hypertension   . Anemia   . SVD (spontaneous vaginal delivery)     x 3  . Diabetes mellitus without complication     type 2  . Headache(784.0)     otc med prn  . History of blood transfusion 03/2013    [facility] - 2 units transfued   Past Surgical History  Procedure Laterality Date  . Salpingectomy Right 2005    adb insicion  . Tubal ligation     No family history on file. History  Substance Use Topics  . Smoking status: Never Smoker   . Smokeless tobacco: Never Used  . Alcohol Use: Yes     Comment: occasional   OB History   Grav Para Term Preterm Abortions TAB SAB Ect Mult Living   3 3        3      Review of Systems  Gastrointestinal: Positive for nausea.  Endocrine:       Hyperglycemia  All other systems reviewed and are negative.   Allergies  Review of patient's allergies indicates no known  allergies.  Home Medications   Prior to Admission medications   Medication Sig Start Date End Date Taking? Authorizing Provider  ferrous sulfate 325 (65 FE) MG tablet Take 325 mg by mouth 2 (two) times daily with a meal.    Yes Historical Provider, MD  lisinopril-hydrochlorothiazide (PRINZIDE,ZESTORETIC) 10-12.5 MG per tablet Take 1 tablet by mouth daily.   Yes Historical Provider, MD  metFORMIN (GLUCOPHAGE) 500 MG tablet Take 500 mg by mouth 2 (two) times daily with a meal.   Yes Historical Provider, MD  norethindrone (AYGESTIN) 5 MG tablet Take 10-15 mg by mouth daily. Takes 10mg  daily and if still bleeding takes another 5mg  in afternoon.   Yes Historical Provider, MD  ibuprofen (ADVIL,MOTRIN) 200 MG tablet Take 400 mg by mouth 2 (two) times daily as needed for headache or cramping.     Historical Provider, MD   BP 113/77  Pulse 105  Temp(Src) 98.2 F (36.8 C) (Oral)  Resp 21  SpO2 99% Physical Exam  Nursing note and vitals reviewed. Constitutional: She is oriented to person, place, and time. Vital signs are normal. She appears well-developed and well-nourished. No distress.  HENT:  Head: Normocephalic and atraumatic.  Right Ear: External ear normal.  Left Ear: External ear normal.  Nose: Nose normal.  Mouth/Throat: Oropharynx is clear and moist.  Eyes: Conjunctivae are  normal. Right eye exhibits no discharge. Left eye exhibits no discharge.  Neck: Normal range of motion. Neck supple. No thyromegaly present.  Cardiovascular: Normal rate, regular rhythm and normal pulses.  Exam reveals no gallop and no friction rub.   Murmur heard.  Decrescendo systolic murmur is present with a grade of 3/6  Pulmonary/Chest: Effort normal and breath sounds normal. No respiratory distress.  Abdominal: Soft. Bowel sounds are normal. She exhibits no distension and no mass. There is no tenderness. There is no rebound and no guarding.  Neurological: She is alert and oriented to person, place, and time.  She has normal strength. Coordination normal.  Skin: Skin is warm and dry. No rash noted. She is not diaphoretic.  Psychiatric: She has a normal mood and affect. Judgment normal.    ED Course  Procedures (including critical care time) Labs Review Labs Reviewed  POCT I-STAT, CHEM 8 - Abnormal; Notable for the following:    Sodium 134 (*)    Glucose, Bld 315 (*)    Hemoglobin 10.2 (*)    HCT 30.0 (*)    All other components within normal limits  POCT URINALYSIS DIP (DEVICE) - Abnormal; Notable for the following:    Glucose, UA >=1000 (*)    Hgb urine dipstick LARGE (*)    Protein, ur 30 (*)    All other components within normal limits    Imaging Review No results found.   MDM   1. Hyperglycemia    Will increase metformin to 850 twice a day instead of 500 twice a day. She will followup with primary care on Monday.   Meds ordered this encounter  Medications  . ondansetron (ZOFRAN-ODT) disintegrating tablet 4 mg    Sig:   . metFORMIN (GLUCOPHAGE) 850 MG tablet    Sig: Take 1 tablet (850 mg total) by mouth 2 (two) times daily with a meal.    Dispense:  14 tablet    Refill:  0    Order Specific Question:  Supervising Provider    Answer:  Jake Michaelis, DAVID C [[id number]]     Liam Graham, PA-C 05/18/13 1050

## 2013-05-18 NOTE — ED Notes (Signed)
Pt c/o sugar levels being high This am, fasting, sugar was 324 Pt states she went to pre-op for hysterectomy appt yest and sugar was 300 Needs to have DM controlled before op.  Alert w/no signs of acute distress.

## 2013-05-18 NOTE — Discharge Instructions (Signed)
I am increasing your metformin to 850 mg twice daily. If the metformin you have at home is a scored tablet that can be cut in half easily, you may take 1-1/2 of your 500 mg tablets twice daily instead of this new prescription. Please followup with your primary care physician on Monday for a recheck.    Hyperglycemia Hyperglycemia occurs when the glucose (sugar) in your blood is too high. Hyperglycemia can happen for many reasons, but it most often happens to people who do not know they have diabetes or are not managing their diabetes properly.  CAUSES  Whether you have diabetes or not, there are other causes of hyperglycemia. Hyperglycemia can occur when you have diabetes, but it can also occur in other situations that you might not be as aware of, such as: Diabetes  If you have diabetes and are having problems controlling your blood glucose, hyperglycemia could occur because of some of the following reasons:  Not following your meal plan.  Not taking your diabetes medications or not taking it properly.  Exercising less or doing less activity than you normally do.  Being sick. Pre-diabetes  This cannot be ignored. Before people develop Type 2 diabetes, they almost always have "pre-diabetes." This is when your blood glucose levels are higher than normal, but not yet high enough to be diagnosed as diabetes. Research has shown that some long-term damage to the body, especially the heart and circulatory system, may already be occurring during pre-diabetes. If you take action to manage your blood glucose when you have pre-diabetes, you may delay or prevent Type 2 diabetes from developing. Stress  If you have diabetes, you may be "diet" controlled or on oral medications or insulin to control your diabetes. However, you may find that your blood glucose is higher than usual in the hospital whether you have diabetes or not. This is often referred to as "stress hyperglycemia." Stress can elevate your  blood glucose. This happens because of hormones put out by the body during times of stress. If stress has been the cause of your high blood glucose, it can be followed regularly by your caregiver. That way he/she can make sure your hyperglycemia does not continue to get worse or progress to diabetes. Steroids  Steroids are medications that act on the infection fighting system (immune system) to block inflammation or infection. One side effect can be a rise in blood glucose. Most people can produce enough extra insulin to allow for this rise, but for those who cannot, steroids make blood glucose levels go even higher. It is not unusual for steroid treatments to "uncover" diabetes that is developing. It is not always possible to determine if the hyperglycemia will go away after the steroids are stopped. A special blood test called an A1c is sometimes done to determine if your blood glucose was elevated before the steroids were started. SYMPTOMS  Thirsty.  Frequent urination.  Dry mouth.  Blurred vision.  Tired or fatigue.  Weakness.  Sleepy.  Tingling in feet or leg. DIAGNOSIS  Diagnosis is made by monitoring blood glucose in one or all of the following ways:  A1c test. This is a chemical found in your blood.  Fingerstick blood glucose monitoring.  Laboratory results. TREATMENT  First, knowing the cause of the hyperglycemia is important before the hyperglycemia can be treated. Treatment may include, but is not be limited to:  Education.  Change or adjustment in medications.  Change or adjustment in meal plan.  Treatment for an  illness, infection, etc.  More frequent blood glucose monitoring.  Change in exercise plan.  Decreasing or stopping steroids.  Lifestyle changes. HOME CARE INSTRUCTIONS   Test your blood glucose as directed.  Exercise regularly. Your caregiver will give you instructions about exercise. Pre-diabetes or diabetes which comes on with stress is  helped by exercising.  Eat wholesome, balanced meals. Eat often and at regular, fixed times. Your caregiver or nutritionist will give you a meal plan to guide your sugar intake.  Being at an ideal weight is important. If needed, losing as little as 10 to 15 pounds may help improve blood glucose levels. SEEK MEDICAL CARE IF:   You have questions about medicine, activity, or diet.  You continue to have symptoms (problems such as increased thirst, urination, or weight gain). SEEK IMMEDIATE MEDICAL CARE IF:   You are vomiting or have diarrhea.  Your breath smells fruity.  You are breathing faster or slower.  You are very sleepy or incoherent.  You have numbness, tingling, or pain in your feet or hands.  You have chest pain.  Your symptoms get worse even though you have been following your caregiver's orders.  If you have any other questions or concerns. Document Released: 06/15/2000 Document Revised: 03/14/2011 Document Reviewed: 04/18/2011 [facility] Patient Information 2014 [place], [place].

## 2013-05-20 NOTE — ED Provider Notes (Signed)
Medical screening examination/treatment/procedure(s) were performed by non-physician practitioner and as supervising physician I was immediately available for consultation/collaboration.  Philipp Deputy, M.D.  Harden Mo, MD 05/20/13 [phone]

## 2013-05-22 ENCOUNTER — Encounter (HOSPITAL_COMMUNITY)
Admission: RE | Disposition: A | Discharge status: Home / Self Care | Payer: Self-pay | Source: Ambulatory Visit | Attending: Obstetrics and Gynecology

## 2013-05-22 ENCOUNTER — Inpatient Hospital Stay (HOSPITAL_COMMUNITY)
Admission: RE | Admit: 2013-05-22 | Discharge: 2013-05-25 | DRG: 742 | Disposition: A | Discharge status: Home / Self Care | Payer: BC Managed Care – PPO | Source: Ambulatory Visit | Attending: Obstetrics and Gynecology | Admitting: Obstetrics and Gynecology

## 2013-05-22 ENCOUNTER — Ambulatory Visit (HOSPITAL_COMMUNITY): Payer: BC Managed Care – PPO | Admitting: Anesthesiology

## 2013-05-22 ENCOUNTER — Encounter (HOSPITAL_COMMUNITY): Payer: BC Managed Care – PPO | Admitting: Anesthesiology

## 2013-05-22 DIAGNOSIS — IMO0002 Reserved for concepts with insufficient information to code with codable children: Secondary | ICD-10-CM

## 2013-05-22 DIAGNOSIS — Y9210 Unspecified residential institution as the place of occurrence of the external cause: Secondary | ICD-10-CM | POA: Diagnosis not present

## 2013-05-22 DIAGNOSIS — E119 Type 2 diabetes mellitus without complications: Secondary | ICD-10-CM | POA: Diagnosis present

## 2013-05-22 DIAGNOSIS — Z6841 Body Mass Index (BMI) 40.0 and over, adult: Secondary | ICD-10-CM

## 2013-05-22 DIAGNOSIS — D649 Anemia, unspecified: Secondary | ICD-10-CM | POA: Diagnosis present

## 2013-05-22 DIAGNOSIS — D251 Intramural leiomyoma of uterus: Secondary | ICD-10-CM | POA: Diagnosis present

## 2013-05-22 DIAGNOSIS — Z90710 Acquired absence of both cervix and uterus: Secondary | ICD-10-CM | POA: Diagnosis present

## 2013-05-22 DIAGNOSIS — I10 Essential (primary) hypertension: Secondary | ICD-10-CM | POA: Diagnosis present

## 2013-05-22 DIAGNOSIS — N920 Excessive and frequent menstruation with regular cycle: Principal | ICD-10-CM | POA: Diagnosis present

## 2013-05-22 DIAGNOSIS — D252 Subserosal leiomyoma of uterus: Secondary | ICD-10-CM | POA: Diagnosis present

## 2013-05-22 DIAGNOSIS — D250 Submucous leiomyoma of uterus: Secondary | ICD-10-CM | POA: Diagnosis present

## 2013-05-22 HISTORY — PX: ABDOMINAL HYSTERECTOMY: SHX81

## 2013-05-22 HISTORY — PX: CYSTOSCOPY: SHX5120

## 2013-05-22 HISTORY — PX: LAPAROSCOPY: SHX197

## 2013-05-22 HISTORY — PX: LAPAROTOMY: SHX154

## 2013-05-22 LAB — CBC
HEMATOCRIT: 21.3 % — AB (ref 36.0–46.0)
Hemoglobin: 6.5 g/dL — CL (ref 12.0–15.0)
MCH: 24.1 pg — AB (ref 26.0–34.0)
MCHC: 30.5 g/dL (ref 30.0–36.0)
MCV: 78.9 fL (ref 78.0–100.0)
Platelets: 421 10*3/uL — ABNORMAL HIGH (ref 150–400)
RBC: 2.7 MIL/uL — ABNORMAL LOW (ref 3.87–5.11)
RDW: 18 % — AB (ref 11.5–15.5)
WBC: 12.9 10*3/uL — ABNORMAL HIGH (ref 4.0–10.5)

## 2013-05-22 LAB — PREGNANCY, URINE: Preg Test, Ur: NEGATIVE

## 2013-05-22 LAB — GLUCOSE, CAPILLARY
GLUCOSE-CAPILLARY: 199 mg/dL — AB (ref 70–99)
GLUCOSE-CAPILLARY: 190 mg/dL — AB (ref 70–99)
Glucose-Capillary: 140 mg/dL — ABNORMAL HIGH (ref 70–99)
Glucose-Capillary: 220 mg/dL — ABNORMAL HIGH (ref 70–99)
Glucose-Capillary: 255 mg/dL — ABNORMAL HIGH (ref 70–99)

## 2013-05-22 LAB — PREPARE RBC (CROSSMATCH)

## 2013-05-22 SURGERY — LAPAROSCOPY OPERATIVE
Anesthesia: General | Site: Bladder

## 2013-05-22 MED ORDER — IBUPROFEN 600 MG PO TABS
600.0000 mg | ORAL_TABLET | Freq: Four times a day (QID) | ORAL | Status: DC | PRN
  Administered 2013-05-24 – 2013-05-25 (×3): 600 mg via ORAL
  Filled 2013-05-22 (×3): qty 1

## 2013-05-22 MED ORDER — STERILE WATER FOR IRRIGATION IR SOLN
Status: DC | PRN
  Administered 2013-05-22: 1000 mL

## 2013-05-22 MED ORDER — HYDROMORPHONE HCL PF 1 MG/ML IJ SOLN
INTRAMUSCULAR | Status: AC
  Filled 2013-05-22: qty 1

## 2013-05-22 MED ORDER — INSULIN ASPART 100 UNIT/ML ~~LOC~~ SOLN
0.0000 [IU] | Freq: Three times a day (TID) | SUBCUTANEOUS | Status: DC
  Administered 2013-05-23 – 2013-05-24 (×4): 3 [IU] via SUBCUTANEOUS
  Administered 2013-05-24: 2 [IU] via SUBCUTANEOUS
  Administered 2013-05-25: 3 [IU] via SUBCUTANEOUS
  Administered 2013-05-25: 12:00:00 via SUBCUTANEOUS

## 2013-05-22 MED ORDER — HYDROMORPHONE HCL PF 1 MG/ML IJ SOLN
0.2500 mg | INTRAMUSCULAR | Status: DC | PRN
  Administered 2013-05-22 (×4): 0.5 mg via INTRAVENOUS

## 2013-05-22 MED ORDER — MENTHOL 3 MG MT LOZG: 1.0000 | LOZENGE | OROMUCOSAL | Status: DC | PRN

## 2013-05-22 MED ORDER — DIPHENHYDRAMINE HCL 50 MG/ML IJ SOLN: 12.5000 mg | Freq: Four times a day (QID) | INTRAMUSCULAR | Status: DC | PRN

## 2013-05-22 MED ORDER — PROPOFOL 10 MG/ML IV EMUL
INTRAVENOUS | Status: AC
  Filled 2013-05-22: qty 20

## 2013-05-22 MED ORDER — STERILE WATER FOR IRRIGATION IR SOLN
Status: DC | PRN
Start: 2013-05-22 — End: 2013-05-22
  Administered 2013-05-22: 80 mL

## 2013-05-22 MED ORDER — CEFAZOLIN SODIUM-DEXTROSE 2-3 GM-% IV SOLR
INTRAVENOUS | Status: AC
  Administered 2013-05-22: 2 g via INTRAVENOUS
  Filled 2013-05-22: qty 50

## 2013-05-22 MED ORDER — HYDROMORPHONE HCL PF 1 MG/ML IJ SOLN
INTRAMUSCULAR | Status: DC | PRN
  Administered 2013-05-22 (×4): 0.5 mg via INTRAVENOUS

## 2013-05-22 MED ORDER — LIDOCAINE HCL (CARDIAC) 20 MG/ML IV SOLN
INTRAVENOUS | Status: AC
  Filled 2013-05-22: qty 5

## 2013-05-22 MED ORDER — NEOSTIGMINE METHYLSULFATE 10 MG/10ML IV SOLN
INTRAVENOUS | Status: DC | PRN
  Administered 2013-05-22: 3 mg via INTRAVENOUS

## 2013-05-22 MED ORDER — KETOROLAC TROMETHAMINE 30 MG/ML IJ SOLN
30.0000 mg | Freq: Four times a day (QID) | INTRAMUSCULAR | Status: DC
  Administered 2013-05-22 – 2013-05-24 (×6): 30 mg via INTRAVENOUS
  Filled 2013-05-22 (×6): qty 1

## 2013-05-22 MED ORDER — KETOROLAC TROMETHAMINE 30 MG/ML IJ SOLN: 30.0000 mg | Freq: Four times a day (QID) | INTRAMUSCULAR | Status: DC

## 2013-05-22 MED ORDER — PHENYLEPHRINE 40 MCG/ML (10ML) SYRINGE FOR IV PUSH (FOR BLOOD PRESSURE SUPPORT)
PREFILLED_SYRINGE | INTRAVENOUS | Status: AC
  Filled 2013-05-22: qty 5

## 2013-05-22 MED ORDER — MIDAZOLAM HCL 2 MG/2ML IJ SOLN
INTRAMUSCULAR | Status: AC
  Filled 2013-05-22: qty 2

## 2013-05-22 MED ORDER — PHENYLEPHRINE HCL 10 MG/ML IJ SOLN
INTRAMUSCULAR | Status: DC | PRN
  Administered 2013-05-22 (×5): 40 ug via INTRAVENOUS

## 2013-05-22 MED ORDER — FENTANYL CITRATE 0.05 MG/ML IJ SOLN
INTRAMUSCULAR | Status: AC
  Filled 2013-05-22: qty 5

## 2013-05-22 MED ORDER — PHENYLEPHRINE HCL 10 MG/ML IJ SOLN
INTRAMUSCULAR | Status: AC
  Filled 2013-05-22: qty 1

## 2013-05-22 MED ORDER — CEFAZOLIN SODIUM-DEXTROSE 2-3 GM-% IV SOLR
2.0000 g | Freq: Once | INTRAVENOUS | Status: AC
  Administered 2013-05-22: 2 g via INTRAVENOUS
  Filled 2013-05-22: qty 50

## 2013-05-22 MED ORDER — SODIUM CHLORIDE 0.9 % IV SOLN
INTRAVENOUS | Status: DC
  Administered 2013-05-22: 500 mL via INTRAVENOUS

## 2013-05-22 MED ORDER — HYDROCODONE-ACETAMINOPHEN 5-325 MG PO TABS
1.0000 | ORAL_TABLET | ORAL | Status: DC | PRN
  Administered 2013-05-24: 1 via ORAL
  Filled 2013-05-22: qty 1

## 2013-05-22 MED ORDER — KETOROLAC TROMETHAMINE 30 MG/ML IJ SOLN: 15.0000 mg | Freq: Once | INTRAMUSCULAR | Status: DC | PRN

## 2013-05-22 MED ORDER — FERROUS SULFATE 325 (65 FE) MG PO TABS
325.0000 mg | ORAL_TABLET | Freq: Two times a day (BID) | ORAL | Status: DC
  Administered 2013-05-23 – 2013-05-25 (×4): 325 mg via ORAL
  Filled 2013-05-22 (×4): qty 1

## 2013-05-22 MED ORDER — DOCUSATE SODIUM 100 MG PO CAPS
100.0000 mg | ORAL_CAPSULE | Freq: Three times a day (TID) | ORAL | Status: DC
  Administered 2013-05-23 – 2013-05-25 (×6): 100 mg via ORAL
  Filled 2013-05-22 (×6): qty 1

## 2013-05-22 MED ORDER — MEPERIDINE HCL 25 MG/ML IJ SOLN: 6.2500 mg | INTRAMUSCULAR | Status: DC | PRN

## 2013-05-22 MED ORDER — ROCURONIUM BROMIDE 100 MG/10ML IV SOLN
INTRAVENOUS | Status: AC
  Filled 2013-05-22: qty 1

## 2013-05-22 MED ORDER — GLYCOPYRROLATE 0.2 MG/ML IJ SOLN
INTRAMUSCULAR | Status: AC
  Filled 2013-05-22: qty 4

## 2013-05-22 MED ORDER — ONDANSETRON HCL 4 MG/2ML IJ SOLN: 4.0000 mg | Freq: Four times a day (QID) | INTRAMUSCULAR | Status: DC | PRN

## 2013-05-22 MED ORDER — CEFAZOLIN SODIUM-DEXTROSE 2-3 GM-% IV SOLR
2.0000 g | INTRAVENOUS | Status: AC
Start: 2013-05-22 — End: 2013-05-22
  Administered 2013-05-22: 2 g via INTRAVENOUS

## 2013-05-22 MED ORDER — BUPIVACAINE HCL (PF) 0.25 % IJ SOLN
INTRAMUSCULAR | Status: DC | PRN
Start: 2013-05-22 — End: 2013-05-22
  Administered 2013-05-22: 10 mL

## 2013-05-22 MED ORDER — DIPHENHYDRAMINE HCL 12.5 MG/5ML PO ELIX: 12.5000 mg | ORAL_SOLUTION | Freq: Four times a day (QID) | ORAL | Status: DC | PRN

## 2013-05-22 MED ORDER — LISINOPRIL-HYDROCHLOROTHIAZIDE 10-12.5 MG PO TABS: 1.0000 | ORAL_TABLET | Freq: Every day | ORAL | Status: DC

## 2013-05-22 MED ORDER — METHYLENE BLUE 1 % INJ SOLN
INTRAMUSCULAR | Status: AC
  Filled 2013-05-22: qty 10

## 2013-05-22 MED ORDER — PROMETHAZINE HCL 25 MG/ML IJ SOLN: 6.2500 mg | INTRAMUSCULAR | Status: DC | PRN

## 2013-05-22 MED ORDER — SODIUM CHLORIDE 0.9 % IJ SOLN
INTRAMUSCULAR | Status: AC
  Filled 2013-05-22: qty 3

## 2013-05-22 MED ORDER — LIDOCAINE HCL (CARDIAC) 20 MG/ML IV SOLN
INTRAVENOUS | Status: DC | PRN
  Administered 2013-05-22: 60 mg via INTRAVENOUS

## 2013-05-22 MED ORDER — ROCURONIUM BROMIDE 100 MG/10ML IV SOLN
INTRAVENOUS | Status: DC | PRN
  Administered 2013-05-22: 10 mg via INTRAVENOUS
  Administered 2013-05-22: 20 mg via INTRAVENOUS
  Administered 2013-05-22: 10 mg via INTRAVENOUS
  Administered 2013-05-22: 50 mg via INTRAVENOUS
  Administered 2013-05-22: 20 mg via INTRAVENOUS

## 2013-05-22 MED ORDER — PHENYLEPHRINE HCL 10 MG/ML IJ SOLN
10.0000 mg | INTRAMUSCULAR | Status: DC | PRN
  Administered 2013-05-22: 50 ug/min via INTRAVENOUS

## 2013-05-22 MED ORDER — FENTANYL CITRATE 0.05 MG/ML IJ SOLN
INTRAMUSCULAR | Status: DC | PRN
  Administered 2013-05-22: 100 ug via INTRAVENOUS
  Administered 2013-05-22: 50 ug via INTRAVENOUS
  Administered 2013-05-22: 100 ug via INTRAVENOUS

## 2013-05-22 MED ORDER — METHYLENE BLUE 1 % INJ SOLN
INTRAMUSCULAR | Status: DC | PRN
  Administered 2013-05-22: 5 mL via INTRAVENOUS

## 2013-05-22 MED ORDER — ONDANSETRON HCL 4 MG/2ML IJ SOLN
INTRAMUSCULAR | Status: DC | PRN
  Administered 2013-05-22: 4 mg via INTRAVENOUS

## 2013-05-22 MED ORDER — NEOSTIGMINE METHYLSULFATE 10 MG/10ML IV SOLN
INTRAVENOUS | Status: AC
  Filled 2013-05-22: qty 1

## 2013-05-22 MED ORDER — GLYCOPYRROLATE 0.2 MG/ML IJ SOLN
INTRAMUSCULAR | Status: DC | PRN
  Administered 2013-05-22: 0.6 mg via INTRAVENOUS

## 2013-05-22 MED ORDER — SODIUM CHLORIDE 0.9 % IJ SOLN: 9.0000 mL | INTRAMUSCULAR | Status: DC | PRN

## 2013-05-22 MED ORDER — DEXTROSE IN LACTATED RINGERS 5 % IV SOLN
INTRAVENOUS | Status: DC
  Administered 2013-05-22: 22:00:00 via INTRAVENOUS

## 2013-05-22 MED ORDER — HYDROMORPHONE 0.3 MG/ML IV SOLN
INTRAVENOUS | Status: DC
  Administered 2013-05-22: 1.2 mg via INTRAVENOUS
  Administered 2013-05-22: 21:00:00 via INTRAVENOUS
  Administered 2013-05-23 (×2): 0.6 mg via INTRAVENOUS
  Administered 2013-05-23 (×2): 0.3 mg via INTRAVENOUS
  Filled 2013-05-22: qty 25

## 2013-05-22 MED ORDER — PROPOFOL 10 MG/ML IV BOLUS
INTRAVENOUS | Status: DC | PRN
  Administered 2013-05-22: 20 mg via INTRAVENOUS
  Administered 2013-05-22: 150 mg via INTRAVENOUS
  Administered 2013-05-22: 30 mg via INTRAVENOUS

## 2013-05-22 MED ORDER — NALOXONE HCL 0.4 MG/ML IJ SOLN: 0.4000 mg | INTRAMUSCULAR | Status: DC | PRN

## 2013-05-22 MED ORDER — METFORMIN HCL 850 MG PO TABS: 850.0000 mg | ORAL_TABLET | Freq: Two times a day (BID) | ORAL | Status: DC

## 2013-05-22 MED ORDER — MIDAZOLAM HCL 2 MG/2ML IJ SOLN: 0.5000 mg | Freq: Once | INTRAMUSCULAR | Status: DC | PRN

## 2013-05-22 MED ORDER — HYDROCHLOROTHIAZIDE 12.5 MG PO CAPS
12.5000 mg | ORAL_CAPSULE | Freq: Every day | ORAL | Status: DC
  Administered 2013-05-24 – 2013-05-25 (×2): 12.5 mg via ORAL
  Filled 2013-05-22 (×4): qty 1

## 2013-05-22 MED ORDER — LACTATED RINGERS IV SOLN
INTRAVENOUS | Status: DC
  Administered 2013-05-22 (×5): via INTRAVENOUS

## 2013-05-22 MED ORDER — DEXAMETHASONE SODIUM PHOSPHATE 10 MG/ML IJ SOLN
INTRAMUSCULAR | Status: AC
  Filled 2013-05-22: qty 1

## 2013-05-22 MED ORDER — METFORMIN HCL 500 MG PO TABS
500.0000 mg | ORAL_TABLET | Freq: Two times a day (BID) | ORAL | Status: DC
  Administered 2013-05-23 – 2013-05-25 (×4): 500 mg via ORAL
  Filled 2013-05-22 (×7): qty 1

## 2013-05-22 MED ORDER — ONDANSETRON HCL 4 MG/2ML IJ SOLN
INTRAMUSCULAR | Status: AC
  Filled 2013-05-22: qty 2

## 2013-05-22 MED ORDER — LISINOPRIL 10 MG PO TABS
10.0000 mg | ORAL_TABLET | Freq: Every day | ORAL | Status: DC
  Administered 2013-05-24 – 2013-05-25 (×2): 10 mg via ORAL
  Filled 2013-05-22 (×4): qty 1

## 2013-05-22 MED ORDER — MIDAZOLAM HCL 5 MG/5ML IJ SOLN
INTRAMUSCULAR | Status: DC | PRN
  Administered 2013-05-22 (×2): 1 mg via INTRAVENOUS
  Administered 2013-05-22: 2 mg via INTRAVENOUS

## 2013-05-22 SURGICAL SUPPLY — 61 items
ADH SKN CLS APL DERMABOND .7 (GAUZE/BANDAGES/DRESSINGS) ×4
ADH SKN CLS LQ APL DERMABOND (GAUZE/BANDAGES/DRESSINGS) ×4
BARRIER ADHS 3X4 INTERCEED (GAUZE/BANDAGES/DRESSINGS) IMPLANT
BRR ADH 4X3 ABS CNTRL BYND (GAUZE/BANDAGES/DRESSINGS)
CHLORAPREP W/TINT 26ML (MISCELLANEOUS) ×5 IMPLANT
CLOTH BEACON ORANGE TIMEOUT ST (SAFETY) ×5 IMPLANT
COUNTER NEEDLE 1200 MAGNETIC (NEEDLE) ×2 IMPLANT
COVER MAYO STAND STRL (DRAPES) ×5 IMPLANT
DERMABOND ADHESIVE PROPEN (GAUZE/BANDAGES/DRESSINGS) ×1
DERMABOND ADVANCED (GAUZE/BANDAGES/DRESSINGS) ×1
DERMABOND ADVANCED .7 DNX12 (GAUZE/BANDAGES/DRESSINGS) ×4 IMPLANT
DERMABOND ADVANCED .7 DNX6 (GAUZE/BANDAGES/DRESSINGS) ×1 IMPLANT
DISSECTOR BLUNT TIP ENDO 5MM (MISCELLANEOUS) IMPLANT
DRAIN CHANNEL 10F 3/8 F FF (DRAIN) ×2 IMPLANT
DRSG OPSITE POSTOP 4X10 (GAUZE/BANDAGES/DRESSINGS) ×2 IMPLANT
EVACUATOR SILICONE 100CC (DRAIN) ×2 IMPLANT
EVACUATOR SMOKE 8.L (FILTER) ×10 IMPLANT
GAUZE SPONGE 4X4 16PLY XRAY LF (GAUZE/BANDAGES/DRESSINGS) ×2 IMPLANT
GLOVE BIO SURGEON STRL SZ 6.5 (GLOVE) ×13 IMPLANT
GLOVE BIOGEL PI IND STRL 7.0 (GLOVE) ×8 IMPLANT
GLOVE BIOGEL PI IND STRL 7.5 (GLOVE) ×4 IMPLANT
GLOVE BIOGEL PI INDICATOR 7.0 (GLOVE) ×5
GLOVE BIOGEL PI INDICATOR 7.5 (GLOVE) ×1
GOWN STRL REUS W/TWL LRG LVL3 (GOWN DISPOSABLE) ×20 IMPLANT
HEMOSTAT SURGICEL 2X14 (HEMOSTASIS) IMPLANT
NS IRRIG 1000ML POUR BTL (IV SOLUTION) ×5 IMPLANT
OCCLUDER COLPOPNEUMO (BALLOONS) ×5 IMPLANT
PACK LAPAROSCOPY BASIN (CUSTOM PROCEDURE TRAY) ×5 IMPLANT
SCISSORS LAP 5X35 DISP (ENDOMECHANICALS) ×6 IMPLANT
SET CYSTO W/LG BORE CLAMP LF (SET/KITS/TRAYS/PACK) IMPLANT
SET IRRIG TUBING LAPAROSCOPIC (IRRIGATION / IRRIGATOR) ×3 IMPLANT
SHEARS HARMONIC ACE PLUS 36CM (ENDOMECHANICALS) ×2 IMPLANT
SOLUTION ELECTROLUBE (MISCELLANEOUS) ×5 IMPLANT
SPONGE LAP 18X18 X RAY DECT (DISPOSABLE) ×6 IMPLANT
STRIP CLOSURE SKIN 1/4X4 (GAUZE/BANDAGES/DRESSINGS) IMPLANT
SUT MNCRL AB 3-0 PS2 27 (SUTURE) ×5 IMPLANT
SUT PDS AB 1 CT1 36 (SUTURE) IMPLANT
SUT SILK 3 0 SH CR/8 (SUTURE) ×4 IMPLANT
SUT VIC AB 0 CT1 18XCR BRD8 (SUTURE) ×2 IMPLANT
SUT VIC AB 0 CT1 27 (SUTURE) ×20
SUT VIC AB 0 CT1 27XBRD ANBCTR (SUTURE) ×3 IMPLANT
SUT VIC AB 0 CT1 27XCR 8 STRN (SUTURE) ×1 IMPLANT
SUT VIC AB 0 CT1 8-18 (SUTURE) ×10
SUT VIC AB 3-0 SH 27 (SUTURE) ×20
SUT VIC AB 3-0 SH 27X BRD (SUTURE) ×4 IMPLANT
SUT VICRYL 0 TIES 12 18 (SUTURE) IMPLANT
SUT VICRYL 0 UR6 27IN ABS (SUTURE) ×10 IMPLANT
SYR 50ML LL SCALE MARK (SYRINGE) ×5 IMPLANT
TIP UTERINE 5.1X6CM LAV DISP (MISCELLANEOUS) IMPLANT
TIP UTERINE 6.7X10CM GRN DISP (MISCELLANEOUS) IMPLANT
TIP UTERINE 6.7X6CM WHT DISP (MISCELLANEOUS) IMPLANT
TIP UTERINE 6.7X8CM BLUE DISP (MISCELLANEOUS) ×4 IMPLANT
TOWEL OR 17X24 6PK STRL BLUE (TOWEL DISPOSABLE) ×10 IMPLANT
TRAY FOLEY CATH 14FR (SET/KITS/TRAYS/PACK) ×5 IMPLANT
TROCAR BALLN 12MMX100 BLUNT (TROCAR) ×5 IMPLANT
TROCAR XCEL NON-BLD 11X100MML (ENDOMECHANICALS) ×5 IMPLANT
TROCAR XCEL NON-BLD 5MMX100MML (ENDOMECHANICALS) ×5 IMPLANT
TROCAR XCEL OPT SLVE 5M 100M (ENDOMECHANICALS) ×5 IMPLANT
TUBING FILTER THERMOFLATOR (ELECTROSURGICAL) ×5 IMPLANT
WARMER LAPAROSCOPE (MISCELLANEOUS) ×5 IMPLANT
WATER STERILE IRR 1000ML POUR (IV SOLUTION) ×5 IMPLANT

## 2013-05-22 NOTE — Op Note (Signed)
Pre OpDiagnosis: Menorrhagia Post OP Diagnosis:  The same with sigmoid colon serosal injury in abdominal pelvic adhesions Procedure:  Laparoscopy lysis of adhesions total abdominal hysterectomy, left salpingectomy, cystoscpy Repair of serosal injury to the sigmoid colon done by Dr. Grandville Silos Surgeon is Dr. Charlesetta Garibaldi Assistant is Dr. Mancel Bale surgeon for the: Serosal repair of the Sigmoid colon by  Dr. Grandville Silos EBL 800 cc IV fluids are 4000 cc Urine output was 600 cc of clear urine  complication :Damage to sigmoid colon serosa Procedure in detail: She was taken  to the operating room placed in dorsal lithotomy position and prepped and draped in the normal sterile fashion.  Rumi was placed without difficulty.  Attention was then turned to the abdomen where Marcaine was used to anesthetize the skin.  allys clamps are placed on the skin and  using her previous incision with a knife and carried down to the fascia.  Fascia was then incised in the peritoneum was entered without difficulty. Vicryl was used on the fascia in a circumferential fashion.  The abdomen was insufflated with CO2 gas. Two 5 mm ports were placed into the right and left lower quadrants.  There were bowel and anterior abdominal wall adhesions and omental adhesions she also had adhesions from the posterior aspect of the uterus to the cul-de-sac and large bowel.   the right ovary was missing the uterus was slightly enlarged with fibroids the left ovary and tube appeared normal the liver appeared normal.  The adhesions from the posterior aspect of the uterus to the bowel and cul-de-sac were lysed without difficulty. The appendix adhesion to the abdominal wall was also lysed without difficulty. abdominal wall adhesions  were lysed and while doing the dissection was noticed that the bowel was very close to where the dissection took place.  I then decided to do an exploratory laparotomy. An incision was made along her previous skin incision and  Pfannenstiel.  Carried down to the fascia the fascia was incised in incised in the midline and carried out bilaterally using Mayo scissors. kochers  X2 were placed on the superior aspect of the fascia was dissected off the rectus muscle both sharply and bluntly.  The inferior aspect of the fascia assistance in a similar fashion.  Peritoneum  was identified and opened without difficulty.  I  inspected the colon and noted  a small serosal injury so we called the general surgeon to come and evaluate the bowel.  This was tagged with a 0 Vicryl suture.  The bowel was then packed away with moist laparotomy sponges and the Balfour retractor was placed without difficulty.  The left round ligaments was clamped cut and ligated without difficulty.  The bladder flap was then dissected sharply and bluntly with a sponge on a stick.  Left uterine ovarian ligament was clamped cut and ligated with 0 Vicryl.  Left fallopian tube was clamped cut and removed and sent to pathology.  Surgeon Dr. Grandville Silos came and evaluated the bowel.  Her evaluation in the [facility] and moist laparotomy sponges were removed.  Then oversewed the serosa of the bowel in the details will be in  her operative note.  She was done we then replaced the Balfour and packed away the bowel.  Right round ligament was grasped cut and ligated and the bladder flap on the right side was name sharply.  Uterine arteries were clamped cut and suture ligated without difficulty.  Better visualization the fundus was removed using the knife.  Cervix was then  removed by cutting the cardinal and uterosacral clamping and cutting the cardinal and uterosacral ligaments.  Suture ligated with 0 Vicryl.  The cuff was ligated with 0 Vicryl in figure-of-eight suture figure-of-eight stitch sutures.  Some bleeding from the bladder pillars which was made hemostatic with figure of 8 3-0 Vicryl suture.  The patient was done and Gelfoam was placed along the area that was bleeding from the bladder  pillars.  Hemostasis was assured.  All instruments were removed from the abdomen.  Fascia was closed with 0 Vicryl the right and left median the middle.  JP drain placed the subcutaneous tissue. She is tissue was reapproximated using 20 plain gut interrupted suture.skin was  closed with 3-0 Monocryl in subcuticular fashion.  The umbilical incision was closed with 3-0 Monocryl subcuticular fashion. All skin incisions were reinforced her closed with Dermabond.  She was given methylene blue.  Cystoscopy was done both ureters were seen to reflux methylene blue the bladder was intact..  the catheter was replaced sponge lap needle counts were correct the roomy was removed bluntly dissected of expiratory laparotomy this is Dr. Charlesetta Garibaldi dictating thank you very much

## 2013-05-22 NOTE — Anesthesia Preprocedure Evaluation (Addendum)
Anesthesia Evaluation  Patient identified by MRN, date of birth, ID band Patient awake    Reviewed: Allergy & Precautions, H&P , Patient's Chart, lab work & pertinent test results, reviewed documented beta blocker date and time   History of Anesthesia Complications Negative for: history of anesthetic complications  Airway Mallampati: III TM Distance: >3 FB Neck ROM: full    Dental   Pulmonary  breath sounds clear to auscultation        Cardiovascular Exercise Tolerance: Good hypertension, Rhythm:regular Rate:Normal     Neuro/Psych  Headaches,    GI/Hepatic   Endo/Other  diabetes, Poorly Controlled, Type 2, Oral Hypoglycemic AgentsMorbid obesity  Renal/GU   Female GU complaint     Musculoskeletal   Abdominal   Peds  Hematology  (+) anemia , Has required blood transfusion (03/2013)   Anesthesia Other Findings   Reproductive/Obstetrics                          Anesthesia Physical Anesthesia Plan  ASA: III  Anesthesia Plan: General ETT   Post-op Pain Management:    Induction:   Airway Management Planned:   Additional Equipment:   Intra-op Plan:   Post-operative Plan:   Informed Consent: I have reviewed the patients History and Physical, chart, labs and discussed the procedure including the risks, benefits and alternatives for the proposed anesthesia with the patient or authorized representative who has indicated his/her understanding and acceptance.   Dental Advisory Given  Plan Discussed with: CRNA and Surgeon  Anesthesia Plan Comments:         Anesthesia Quick Evaluation

## 2013-05-22 NOTE — Interval H&P Note (Signed)
History and Physical Interval Note:  05/22/2013 1:28 PM  [name]  has presented today for surgery, with the diagnosis of Uterine Fibroids - 3 hours  The various methods of treatment have been discussed with the patient and family. After consideration of risks, benefits and other options for treatment, the patient has consented to  Procedure(s): TOTAL LAPAROSCOPIC HYSTERECTOMY Bilateral Salpingectomy Cystoscopy poss. LAVH poss. TAH (N/A) as a surgical intervention .  The patient's history has been reviewed, patient examined, no change in status, stable for surgery.  I have reviewed the patient's chart and labs.  Questions were answered to the patient's satisfaction.     Arlester Keehan A Keyston Ardolino

## 2013-05-22 NOTE — OR Nursing (Signed)
Catheter in At 1356 by D Coraima Tibbs RN

## 2013-05-22 NOTE — Anesthesia Postprocedure Evaluation (Signed)
  Anesthesia Post-op Note  Patient: [name]  Procedure(s) Performed: Procedure(s): LAPAROSCOPY OPERATIVE  LOA EXPLORATORY LAPAROTOMY REPAIR OF SIGMOID COLON SEROSA  (N/A) HYSTERECTOMY ABDOMINAL LEFT SALPINGECTOMY (N/A) CYSTOSCOPY (N/A)  Patient Location: PACU  Anesthesia Type:General  Level of Consciousness: awake, alert  and oriented  Airway and Oxygen Therapy: Patient Spontanous Breathing and Patient connected to nasal cannula oxygen  Post-op Pain: mild  Post-op Assessment: Post-op Vital signs reviewed, Patient's Cardiovascular Status Stable, Respiratory Function Stable, Patent Airway, No signs of Nausea or vomiting and Pain level controlled  Post-op Vital Signs: Reviewed and stable  Last Vitals:  Filed Vitals:   05/22/13 1900  BP: 127/55  Pulse: 93  Temp:   Resp: 20    Complications: No apparent anesthesia complications

## 2013-05-22 NOTE — H&P (View-Only) (Signed)
[name] is a 43 y.o.  female P 3-0-0-3 presents for hysterectomy because of menorrhagia, anemia  and fibroids.  For the past several years the patient has had increasing length and volume of her menses but fortunately no cramping.  Her menstrual flow lasts for 7-12 days, accompanied by clots requiring her to change her pad every 30-45 minutes.  She denies any changes in urinary/bowel function and no dyspareunia or vaginitis symptoms.  In March 2015 she was transfused 2 units of packed red blood cells due to symptomatic  anemia attributed to her menorrhagia.  Her hemoglobin/hematocrit was 7.7/25.0 respectively though her TSH was normal and gonorrhea/chlyamydia tests negative.   A SHG in March 2015 showed: uterus: 8.29 x 6.64 x 6.59 cm with multiple fibroids;   #3 intramural fibroids- 2.6 x 2.8 x 2.4 cm;   2.9 x 3.0 x 2.8 cm with a submucosal component and 2.9 x 2.9 x 2.7 cm.  The saline infusion showed a sub-mucosal fibroid in the lower 1/3 of endometrial cavity 2.7 c 1.8 cm.   Since her transfusion in March, attempts were made to control her bleeding with Provera 20 mg daily that initially worked but then failed.  She was subsequently placed on Aygestin 10 mg daily that also curtailed her bleeding initially, however, the effect was short-lived.   A review of both medical and surgical management options were given to the patient however, given the debilitating nature of her symptoms, along with radiographic findings,  she has decided to proceed with definitive therapy in the form of hysterectomy.   Past Medical History  OB History: G3;  P 3-0-03;   SVB 1989, 1991 and 2001   GYN History: menarche: 43 YO;     LMP: 04/30/13;     Contracepton: Tubal Sterilization; Admits to Herpes Simplex 2 but  denies history of abnormal PAP smear;   Last PAP smear: 2015  Medical History: Hypertension, Diabetes Mellitus, Anemia, Menstrual Headaches  Surgical History:  2001  Tubal Sterilzation   2005 Right  Salpingo-oophorectomy (for ovarian cyst)   2014 Left Eye Chalzion Removal Denies problems with anesthesia;  was transfused 2 units of packed red blood cells March 2015  Family History: Hypertension and Diabetes Mellitus  Social History: Divorced and employed as a Psychologist, sport and exercise;  Denies tobacco use but occasionally uses alcohol   Medications:  Metformin  500 mg daily Aygestin 5 mg  2 daily FeSO4  325 mg 2 daily Lisinopril 10 mg  daily   Denies sensitivity to peanuts, shellfish, soy, latex or adhesives.   No Known Allergies  ROS: Admits to menstrual headaches;   Denies  vision changes, nasal congestion, dysphagia, tinnitus, dizziness, hoarseness, cough,  chest pain, shortness of breath, nausea, vomiting, diarrhea,constipation,  urinary frequency, urgency  dysuria, hematuria, vaginitis symptoms, pelvic pain, swelling of joints,easy bruising,  myalgias, arthralgias, skin rashes, unexplained weight loss and except as is mentioned in the history of present illness, patient's review of systems is otherwise negative.   Physical Exam  Bp: 122/84   P: 60    R: 15   Temperature: 98.5 degrees F orally   Weight: 240 lbs.  Height: 5\' 3"   BMI: 42.5  Neck: supple without masses or thyromegaly Lungs: clear to auscultation Heart: regular rate and rhythm Abdomen: soft, non-tender and no organomegaly Pelvic:EGBUS- wnl; vagina-normal rugae, large blood in vault;  uterus-appears normal size though exam limited by habitus, cervix without lesions or motion tenderness though exam limited by habitus;  adnexae-no tenderness  or masses Extremities:  no clubbing, cyanosis or edema  Endometrial Biopsy-abundant blood and benign weakly proliferative endometrium, no atypia, hyperplasia or malignancy.  Assesment:  Menorrhagia            Anemia            Submucosal Fibroid   Disposition:  A discussion was held with patient regarding the indication for her procedure(s) along with the risks, which include  but are not limited to: reaction to anesthesia, damage to adjacent organs, infection and excessive bleeding.  The patient was given a Miralax bowel prep to be completed the day before her surgery. She  verbalized understanding of her preoperative instructions and the risks associated with her surgery and has consented to proceed with a Total Laparoscopic Hysterectomy with Bilateral Salpingectomy, Possible Laparoscopically Assisted Vaginal Hysterectomy, Possible Total Abdominal Hysterectomy and Cystoscopy at [facility] on May 22, 2013 at 1:30 p.m.   CSN# [id number]   Deja Kaigler J. Florene Glen, PA-C  for Dr. Franklyn Lor. Dillard

## 2013-05-22 NOTE — Transfer of Care (Signed)
Immediate Anesthesia Transfer of Care Note  Patient: [name]  Procedure(s) Performed: Procedure(s): LAPAROSCOPY OPERATIVE  LOA EXPLORATORY LAPAROTOMY REPAIR OF SIGMOID COLON SEROSA  (N/A) HYSTERECTOMY ABDOMINAL LEFT SALPINGECTOMY (N/A)  Patient Location: PACU  Anesthesia Type:General  Level of Consciousness: sedated  Airway & Oxygen Therapy: Patient Spontanous Breathing and Patient connected to nasal cannula oxygen  Post-op Assessment: Report given to PACU RN and Post -op Vital signs reviewed and stable  Post vital signs: stable  Complications: No apparent anesthesia complications

## 2013-05-22 NOTE — Op Note (Signed)
05/22/2013  4:37 PM  PATIENT:  [name]  44 y.o. female  Patient Care Team: No Pcp Per Patient as PCP - General (General Practice)  PRE-OPERATIVE DIAGNOSIS:  Uterine Fibroids  POST-OPERATIVE DIAGNOSIS:  Uterine Fibroids, sigmoid colon serosa tear  PROCEDURE: repair of serosal tear  SURGEON: Leighton Ruff, MD  ANESTHESIA:   general  EBL:  Total I/O In: 2000 [I.V.:2000] Out: 750 [Urine:300; Blood:450]  INDICATION: This is a 43 year old female who was undergoing a hysterectomy today for uterine fibroids. The patient was noted to have dense pelvic adhesions and there was concern of a serosal tear in her bowel. I was called to the operating room for evaluation by the attending surgeon.  OR FINDINGS: Sigmoid colon serosa tear, mesenteric border  DESCRIPTION: I arrived in the operating room at the request of Dr. Charlesetta Garibaldi.  She asked me to evaluate a portion of bowel which was then noticed had a serosal tear after using an energy device nearby.  Upon further dissection, I saw a Pfannenstiel incision with a portion of sigmoid colon adherent to the pelvic sidewall. This adhesion was taken down using electrocautery. Was well maintained of the sigmoid colon out of the wound and evaluate. There was a serosal tear noted to be approximately 2 cm in length and less than 1 cm wide. This was on the mesenteric border. There was no full-thickness injury noted.  A serosal tear was closed using interrupted 3-0 silk sutures. A portion of pericolonic fat was then closed over this also using interrupted 3-0 silk sutures.  I inspected once again it appeared to be completely intact. The colon was palpated and felt to be patent. Given that there was no full-thickness injury, I did not feel the need to perform a leak test.  We inspected the surrounding tissue. There were no other issues identified.  I turned the case back over to Dr. Charlesetta Garibaldi for completion of the procedure.

## 2013-05-23 ENCOUNTER — Encounter (HOSPITAL_COMMUNITY): Payer: Self-pay | Admitting: *Deleted

## 2013-05-23 LAB — TYPE AND SCREEN
ABO/RH(D): A POS
ANTIBODY SCREEN: NEGATIVE
UNIT DIVISION: 0
Unit division: 0

## 2013-05-23 LAB — BASIC METABOLIC PANEL
BUN: 5 mg/dL — ABNORMAL LOW (ref 6–23)
CHLORIDE: 102 meq/L (ref 96–112)
CO2: 26 meq/L (ref 19–32)
Calcium: 8.2 mg/dL — ABNORMAL LOW (ref 8.4–10.5)
Creatinine, Ser: 0.67 mg/dL (ref 0.50–1.10)
GFR calc Af Amer: >90 mL/min (ref >90)
GFR calc non Af Amer: >90 mL/min (ref >90)
GLUCOSE: 286 mg/dL — AB (ref 70–99)
Potassium: 4.2 mEq/L (ref 3.7–5.3)
Sodium: 138 mEq/L (ref 137–147)

## 2013-05-23 LAB — GLUCOSE, CAPILLARY
GLUCOSE-CAPILLARY: 195 mg/dL — AB (ref 70–99)
GLUCOSE-CAPILLARY: 258 mg/dL — AB (ref 70–99)
Glucose-Capillary: 302 mg/dL — ABNORMAL HIGH (ref 70–99)
Glucose-Capillary: 223 mg/dL — ABNORMAL HIGH (ref 70–99)
Glucose-Capillary: 174 mg/dL — ABNORMAL HIGH (ref 70–99)
Glucose-Capillary: 199 mg/dL — ABNORMAL HIGH (ref 70–99)

## 2013-05-23 MED ORDER — LACTATED RINGERS IV SOLN
INTRAVENOUS | Status: DC
  Administered 2013-05-23 (×2): via INTRAVENOUS

## 2013-05-23 NOTE — Addendum Note (Signed)
Addendum created 05/23/13 0806 by Talbot Grumbling, CRNA   Modules edited: Notes Section   Notes Section:  File: [id number]

## 2013-05-23 NOTE — Progress Notes (Signed)
1 Day Post-Op Procedure(s): LAPAROSCOPY OPERATIVE  LOA EXPLORATORY LAPAROTOMY REPAIR OF SIGMOID COLON SEROSA  HYSTERECTOMY ABDOMINAL LEFT SALPINGECTOMY CYSTOSCOPY  Subjective: Patient reports tolerating PO and + flatus.    Objective: BP 123/64  Pulse 84  Temp(Src) 98.6 F (37 C) (Oral)  Resp 15  Ht 5\' 4"  (1.626 m)  Wt 108.41 kg (239 lb)  BMI 41.00 kg/m2  SpO2 99%  General: alert and cooperative Resp: clear to auscultation bilaterally Cardio: regular rate and rhythm GI: soft, non-tender; bowel sounds normal; no masses,  no organomegaly Vaginal Bleeding: minimal  Assessment: s/p Procedure(s): LAPAROSCOPY OPERATIVE  LOA EXPLORATORY LAPAROTOMY REPAIR OF SIGMOID COLON SEROSA  HYSTERECTOMY ABDOMINAL LEFT SALPINGECTOMY CYSTOSCOPY: stable  Plan: advance diet slowly and dc PCA and ivf once tolerating full liquids Pt may go home once she has a bowel movement per surgery Encourage ambulation   LOS: 1 day    Shikara Mcauliffe A Dayane Hillenburg 05/23/2013, 10:53 AM

## 2013-05-23 NOTE — Progress Notes (Signed)
[name] is a6 y.o.  [id number]  Post Op Date # Laparoscopic Lysis of Adhesions/ Cystoscopy/TAH/Serosal Bowel Repair  Subjective: Patient is Doing well postoperatively. Patient has controlled pain with PCA, ambulating without difficulty and has pass small flatus.; tolerating liquids but reports not having an appetite. Foley remains with good output-to remove.   Objective: Vital signs in last 24 hours: Temp:  [97.4 F (36.3 C)-98.7 F (37.1 C)] 97.9 F (36.6 C) (05/21 0549) Pulse Rate:  [84-101] 98 (05/21 0549) Resp:  [18-32] 21 (05/21 0549) BP: (108-132)/(55-83) 128/68 mmHg (05/21 0549) SpO2:  [98 %-100 %] 99 % (05/21 0549) Weight:  [239 lb (108.41 kg)] 239 lb (108.41 kg) (05/21 0133)  Intake/Output from previous day: 05/20 0701 - 05/21 0700 In: 8009.6 [P.O.:505; I.V.:6417.1] Out: 2859 [Urine:1850; Drains:59] Intake/Output this shift:    Recent Labs Lab 05/17/13 0850 05/18/13 1024 05/22/13 1856 05/23/13 0545  WBC 10.5  --  12.9* 10.4  HGB 7.9* 10.2* 6.5* 8.5*  HCT 26.2* 30.0* 21.3* 26.9*  PLT 454*  --  421* 330     Recent Labs Lab 05/17/13 0850 05/18/13 1024 05/23/13 0545  NA 133* 134* 138  K 4.3 4.4 4.2  CL 98 99 102  CO2 24  --  26  BUN 10 9 5*  CREATININE 0.69 0.70 0.67  CALCIUM 9.3  --  8.2*  PROT 7.3  --   --   BILITOT <0.2*  --   --   ALKPHOS 52  --   --   ALT 9  --   --   AST 8  --   --   GLUCOSE 304* 315* 286*    EXAM: General: alert, cooperative and in no distress LUNGS-clear bilaterally without wheezes, rales or rhonchi HEART:  RRR with 2/6 systolic murmur Abdomen: bowel sounds present, incision intact without evidence of infection; JP drain with approximately 10cc of pink fluid Extremities:  SCD hose in place and functioning; no calf tenderness    Assessment: s/p Procedure(s): LAPAROSCOPY OPERATIVE  LOA EXPLORATORY LAPAROTOMY REPAIR OF SIGMOID COLON SEROSA  HYSTERECTOMY ABDOMINAL LEFT SALPINGECTOMY CYSTOSCOPY: stable,  progressing well and anemia S/P Transfusion 2U PRBC  Plan: Advance diet Encourage ambulation Discontinue Foley Begin oral medication once she has begun to eat Routine care  LOS: 1 day    Earnstine Regal, PA-C 05/23/2013 7:36 AM

## 2013-05-23 NOTE — Progress Notes (Addendum)
Inpatient Diabetes Program Recommendations  AACE/ADA: New Consensus Statement on Inpatient Glycemic Control (2013)  Target Ranges:  Prepandial:   less than 140 mg/dL      Peak postprandial:   less than 180 mg/dL (1-2 hours)      Critically ill patients:  140 - 180 mg/dL   Results for [name], [name] (MRN [id number]) as of 05/23/2013 07:18  Ref. Range 05/22/2013 17:34 05/22/2013 18:59 05/22/2013 21:13 05/23/2013 00:05 05/23/2013 04:15  Glucose-Capillary Latest Range: 70-99 mg/dL 190 (H) 220 (H) 255 (H) 258 (H) 302 (H)   Diabetes history: DM2 Outpatient Diabetes medications: Metformin 500 mg BID Current orders for Inpatient glycemic control: Metformin 500 mg BID, Novolog 0-15 units AC (to start this morning at 8am)  Inpatient Diabetes Program Recommendations HgbA1C: Please conisider ordering an A1C to evaluate glycemic control over the past 2-3 months. Diet: When diet is advanced, please order Carb Modified diet.  Thanks,  Barnie Alderman, RN, MSN, CCRN Diabetes Coordinator Inpatient Diabetes Program [phone] (Team Pager) [phone] ([facility] office) [phone] [facility] office)

## 2013-05-23 NOTE — Anesthesia Postprocedure Evaluation (Signed)
Anesthesia Post Note  Patient: [name]  Procedure(s) Performed: Procedure(s) (LRB): LAPAROSCOPY OPERATIVE  LOA EXPLORATORY LAPAROTOMY REPAIR OF SIGMOID COLON SEROSA  (N/A) HYSTERECTOMY ABDOMINAL LEFT SALPINGECTOMY (N/A) CYSTOSCOPY (N/A)  Anesthesia type: General  Patient location: Mother/Baby  Post pain: Pain level controlled  Post assessment: Post-op Vital signs reviewed  Last Vitals:  Filed Vitals:   05/23/13 0549  BP: 128/68  Pulse: 98  Temp: 36.6 C  Resp: 21    Post vital signs: Reviewed  Level of consciousness: awake and alert   Complications: No apparent anesthesia complications

## 2013-05-23 NOTE — Progress Notes (Signed)
1 Day Post-Op  Subjective: Doing well.  Ambulated.  Tolerating clears. No nausea, some belching noted.  Passing some flatus.  Pain controlled  Objective: Vital signs in last 24 hours: Temp:  [97.4 F (36.3 C)-98.7 F (37.1 C)] 98 F (36.7 C) (05/21 1400) Pulse Rate:  [84-101] 84 (05/21 1016) Resp:  [14-32] 18 (05/21 1447) BP: (108-132)/(55-83) 123/70 mmHg (05/21 1400) SpO2:  [97 %-100 %] 97 % (05/21 1447) Weight:  [239 lb (108.41 kg)] 239 lb (108.41 kg) (05/21 0133)   Intake/Output from previous day: 05/20 0701 - 05/21 0700 In: 8009.6 [P.O.:505; I.V.:6417.1; Blood:687.5; IV Piggyback:50] Out: 2440 [Urine:2150; Drains:71; Blood:950] Intake/Output this shift: Total I/O In: 1712.9 [P.O.:540; I.V.:1172.9] Out: 364 [Urine:350; Drains:14]   General appearance: alert and cooperative GI: soft, appropriately tender  Incision: no significant drainage  Lab Results:   Recent Labs  05/22/13 1856 05/23/13 0545  WBC 12.9* 10.4  HGB 6.5* 8.5*  HCT 21.3* 26.9*  PLT 421* 330   BMET  Recent Labs  05/23/13 0545  NA 138  K 4.2  CL 102  CO2 26  GLUCOSE 286*  BUN 5*  CREATININE 0.67  CALCIUM 8.2*   PT/INR No results found for this basename: LABPROT, INR,  in the last 72 hours ABG No results found for this basename: PHART, PCO2, PO2, HCO3,  in the last 72 hours  MEDS, Scheduled . docusate sodium  100 mg Oral TID WC  . ferrous sulfate  325 mg Oral BID WC  . lisinopril  10 mg Oral Daily   And  . hydrochlorothiazide  12.5 mg Oral Daily  . insulin aspart  0-15 Units Subcutaneous TID WC  . ketorolac  30 mg Intravenous 4 times per day   Or  . ketorolac  30 mg Intramuscular 4 times per day  . metFORMIN  500 mg Oral BID WC    Studies/Results: No results found.  Assessment: s/p Procedure(s): LAPAROSCOPY OPERATIVE  LOA EXPLORATORY LAPAROTOMY REPAIR OF SIGMOID COLON SEROSA  HYSTERECTOMY ABDOMINAL LEFT SALPINGECTOMY CYSTOSCOPY Patient Active Problem List   Diagnosis  Date Noted  . S/P hysterectomy 05/22/2013  . Menorrhagia 03/21/2013  . OBESITY, NOS 03/02/2006  . MIGRAINE, UNSPEC., W/O INTRACTABLE MIGRAINE 03/02/2006    Expected post op course  Plan: advance diet as tolerated Await return of bowel function   LOS: 1 day     .Rosario Adie, [facility], [place]   05/23/2013 4:52 PM

## 2013-05-24 LAB — GLUCOSE, CAPILLARY
GLUCOSE-CAPILLARY: 149 mg/dL — AB (ref 70–99)
GLUCOSE-CAPILLARY: 169 mg/dL — AB (ref 70–99)
GLUCOSE-CAPILLARY: 195 mg/dL — AB (ref 70–99)
Glucose-Capillary: 162 mg/dL — ABNORMAL HIGH (ref 70–99)
Glucose-Capillary: 163 mg/dL — ABNORMAL HIGH (ref 70–99)
Glucose-Capillary: 150 mg/dL — ABNORMAL HIGH (ref 70–99)

## 2013-05-24 LAB — CBC WITH DIFFERENTIAL/PLATELET
BASOS ABS: 0 10*3/uL (ref 0.0–0.1)
Basophils Relative: 0 % (ref 0–1)
EOS ABS: 0 10*3/uL (ref 0.0–0.7)
Eosinophils Relative: 0 % (ref 0–5)
HCT: 26.9 % — ABNORMAL LOW (ref 36.0–46.0)
HEMOGLOBIN: 8.5 g/dL — AB (ref 12.0–15.0)
Lymphocytes Relative: 11 % — ABNORMAL LOW (ref 12–46)
Lymphs Abs: 1.2 10*3/uL (ref 0.7–4.0)
MCH: 25.4 pg — AB (ref 26.0–34.0)
MCHC: 31.6 g/dL (ref 30.0–36.0)
MCV: 80.3 fL (ref 78.0–100.0)
MONO ABS: 0.8 10*3/uL (ref 0.1–1.0)
MONOS PCT: 8 % (ref 3–12)
NEUTROS ABS: 8.4 10*3/uL — AB (ref 1.7–7.7)
Neutrophils Relative %: 80 % — ABNORMAL HIGH (ref 43–77)
Platelets: 330 10*3/uL (ref 150–400)
RBC: 3.35 MIL/uL — ABNORMAL LOW (ref 3.87–5.11)
RDW: 16.7 % — AB (ref 11.5–15.5)
WBC: 10.4 10*3/uL (ref 4.0–10.5)

## 2013-05-24 MED ORDER — IBUPROFEN 600 MG PO TABS: ORAL_TABLET | ORAL | Status: DC

## 2013-05-24 MED ORDER — HYDROCODONE-ACETAMINOPHEN 5-325 MG PO TABS: 1.0000 | ORAL_TABLET | Freq: Four times a day (QID) | ORAL | Status: DC | PRN

## 2013-05-24 NOTE — Progress Notes (Signed)
Agree with PA note. 

## 2013-05-24 NOTE — Progress Notes (Signed)
SHERMIKA BALTHASER is a31 y.o.  [id number]  Post Op Date # 2: Laparoscopic Lysis of Adhesions/ Cystoscopy/TAH/Serosal Bowel Repair   Subjective: Patient is Doing well postoperatively. Patient has The patient is not having any pain., passing flatus, ambulating in the halls, voiding without difficulty and now has an appetite.  Has been tolerating full liquids but is ordering a regular modified carbohydrate diet.   Objective: Vital signs in last 24 hours: Temp:  [98 F (36.7 C)-98.9 F (37.2 C)] 98.3 F (36.8 C) (05/22 0525) Pulse Rate:  [84-94] 94 (05/22 0525) Resp:  [14-20] 16 (05/22 0525) BP: (116-123)/(61-73) 122/61 mmHg (05/22 0525) SpO2:  [94 %-100 %] 100 % (05/22 0525)  Intake/Output from previous day: 05/21 0701 - 05/22 0700 In: 2192.9 [P.O.:1020; I.V.:1172.9] Out: 1685 [Urine:1650; Drains:35] Intake/Output this shift:    Recent Labs Lab 05/17/13 0850 05/18/13 1024 05/22/13 1856 05/23/13 0545  WBC 10.5  --  12.9* 10.4  HGB 7.9* 10.2* 6.5* 8.5*  HCT 26.2* 30.0* 21.3* 26.9*  PLT 454*  --  421* 330     Recent Labs Lab 05/17/13 0850 05/18/13 1024 05/23/13 0545  NA 133* 134* 138  K 4.3 4.4 4.2  CL 98 99 102  CO2 24  --  26  BUN 10 9 5*  CREATININE 0.69 0.70 0.67  CALCIUM 9.3  --  8.2*  PROT 7.3  --   --   BILITOT <0.2*  --   --   ALKPHOS 52  --   --   ALT 9  --   --   AST 8  --   --   GLUCOSE 304* 315* 286*    EXAM: General: alert, cooperative and no distress Resp: clear to auscultation bilaterally Cardio: RRR, 2/6 systolic murmur GI: Bowel sounds present, incision intact without evidence of infection, scant fluid in JP drain Extremities: No calf tenderness. Vaginal Bleeding: none   Assessment: s/p Procedure(s): LAPAROSCOPY OPERATIVE  LOA EXPLORATORY LAPAROTOMY REPAIR OF SIGMOID COLON SEROSA  HYSTERECTOMY ABDOMINAL LEFT SALPINGECTOMY CYSTOSCOPY: progressing well  Plan: Advance diet Probable discharge home.  LOS: 2 days    Earnstine Regal,  PA-C 05/24/2013 7:09 AM

## 2013-05-24 NOTE — Progress Notes (Signed)
Pt seen and examined.  She is doing well Plan for discharge once she has a BM

## 2013-05-24 NOTE — Discharge Instructions (Signed)
Call [facility], [phone] for:  Post operative appointment time and date.  Schedule for removal of JP drain next week and post operative visit in 6 weeks with Dr. Charlesetta Garibaldi Temperature greater than or equal to 100.4 degrees Farenheit orally Excessive pain not managed with your pain medications Excessive bleeding, problems urinating or other concerns   While taking pain medications, take Colace (Docusate Sodium) 100 mg 2-3 times daily until bowel movements are regular to prevent constipation. Continue taking your iron supplement twice a day for 12 weeks  You may shower You may walk up stairs slowly  You may drive after  2  weeks You may lift items greater than 15  pounds after  6 weeks You should not resume sexual activity until after  6  weeks  You may resume a  regular modified carbohydrate diet

## 2013-05-24 NOTE — Discharge Summary (Signed)
Physician Discharge Summary  Patient ID: [name] MRN: [id number] DOB/AGE: [birth year] 43 y.o.  Admit date: 05/22/2013 Discharge date: 05/24/2013   Discharge Diagnoses:  Menorrhagia, Anemia and Submucosal Fibroid Active Problems:   S/P hysterectomy   Operation: Laparoscopy,  Lysis of Adhesions,  Cystoscopy, Total Abdominal Hysterectomy and Repair of Bowel (Sigmoid)  Serosa  Discharged Condition: Good  Hospital Course: On the date of admission the patient underwent the aforementioned procedures and tolerated them well.  The patient was transfused 2 units of packed red blood cells for a post operative hemoglobin of 6.5 (pre-op Hgb. 7.7) and increased her hemoglobin to 8.8.5.  The patient was noted to have a slow return of bowel function but by post operative day number #2 she had resumed bowel and bladder function and was therefore deemed ready for discharge home.  Disposition: 01-Home or Self Care  Discharge Medications:    Medication List    STOP taking these medications       norethindrone 5 MG tablet  Commonly known as:  AYGESTIN      TAKE these medications       ferrous sulfate 325 (65 FE) MG tablet  Take 325 mg by mouth 2 (two) times daily with a meal.     HYDROcodone-acetaminophen 5-325 MG per tablet  Commonly known as:  NORCO/VICODIN  Take 1-2 tablets by mouth every 6 (six) hours as needed for moderate pain.     ibuprofen 600 MG tablet  Commonly known as:  ADVIL,MOTRIN  1  po   pc  every 6 hours for 5 days then prn-pain     lisinopril-hydrochlorothiazide 10-12.5 MG per tablet  Commonly known as:  PRINZIDE,ZESTORETIC  Take 1 tablet by mouth daily.     metFORMIN 500 MG tablet  Commonly known as:  GLUCOPHAGE  Take 500 mg by mouth 2 (two) times daily with a meal.     metFORMIN 850 MG tablet  Commonly known as:  GLUCOPHAGE  Take 1 tablet (850 mg total) by mouth 2 (two) times daily with a meal.          Follow-up: Dr. Gwynneth Munson A. Dillard for removal of JP  drain in 1 week and post operative visit in 6 weeks  Signed: Earnstine Regal, PA-C 05/24/2013, 7:21 AM

## 2013-05-24 NOTE — Progress Notes (Signed)
2 Days Post-Op  Subjective: Doing well they have advanced her diet.  She is having flatus, no Bm.  Up in chair, walking and overall doing pretty well.  Drain is clear.    Objective: Vital signs in last 24 hours: Temp:  [98 F (36.7 C)-98.9 F (37.2 C)] 98.3 F (36.8 C) (05/22 0525) Pulse Rate:  [84-94] 94 (05/22 0525) Resp:  [14-20] 16 (05/22 0525) BP: (116-123)/(61-73) 122/61 mmHg (05/22 0525) SpO2:  [94 %-100 %] 100 % (05/22 0525) Last BM Date: 05/22/13 1020 PO; diet advanced to Carb Modified this AM 35 ml from the drain Afebrile, VSS No labs today, glucose 163 this AM  Intake/Output from previous day: 05/21 0701 - 05/22 0700 In: 2192.9 [P.O.:1020; I.V.:1172.9] Out: 3818 [Urine:1650; Drains:35] Intake/Output this shift: Total I/O In: -  Out: 200 [Urine:200]  General appearance: alert, cooperative and no distress GI: up in chair, she is sore, port sites OK.    Lab Results:   Recent Labs  05/22/13 1856 05/23/13 0545  WBC 12.9* 10.4  HGB 6.5* 8.5*  HCT 21.3* 26.9*  PLT 421* 330    BMET  Recent Labs  05/23/13 0545  NA 138  K 4.2  CL 102  CO2 26  GLUCOSE 286*  BUN 5*  CREATININE 0.67  CALCIUM 8.2*   PT/INR No results found for this basename: LABPROT, INR,  in the last 72 hours  No results found for this basename: AST, ALT, ALKPHOS, BILITOT, PROT, ALBUMIN,  in the last 168 hours   Lipase  No results found for this basename: lipase     Studies/Results: No results found.  Medications: . docusate sodium  100 mg Oral TID WC  . ferrous sulfate  325 mg Oral BID WC  . lisinopril  10 mg Oral Daily   And  . hydrochlorothiazide  12.5 mg Oral Daily  . insulin aspart  0-15 Units Subcutaneous TID WC  . ketorolac  30 mg Intravenous 4 times per day   Or  . ketorolac  30 mg Intramuscular 4 times per day  . metFORMIN  500 mg Oral BID WC   . lactated ringers Stopped (05/23/13 1450)   Prior to Admission medications   Medication Sig Start Date End Date  Taking? Authorizing Provider  ferrous sulfate 325 (65 FE) MG tablet Take 325 mg by mouth 2 (two) times daily with a meal.    Yes Historical Provider, MD  lisinopril-hydrochlorothiazide (PRINZIDE,ZESTORETIC) 10-12.5 MG per tablet Take 1 tablet by mouth daily.   Yes Historical Provider, MD  metFORMIN (GLUCOPHAGE) 850 MG tablet Take 1 tablet (850 mg total) by mouth 2 (two) times daily with a meal. 05/18/13  Yes Liam Graham, PA-C  HYDROcodone-acetaminophen (NORCO/VICODIN) 5-325 MG per tablet Take 1-2 tablets by mouth every 6 (six) hours as needed for moderate pain. 05/24/13   Earnstine Regal, PA-C  ibuprofen (ADVIL,MOTRIN) 200 MG tablet Take 400 mg by mouth 2 (two) times daily as needed for headache or cramping.     Historical Provider, MD  ibuprofen (ADVIL,MOTRIN) 600 MG tablet 1  po   pc  every 6 hours for 5 days then prn-pain 05/24/13   Earnstine Regal, PA-C  metFORMIN (GLUCOPHAGE) 500 MG tablet Take 500 mg by mouth 2 (two) times daily with a meal.    Historical Provider, MD  norethindrone (AYGESTIN) 5 MG tablet Take 10-15 mg by mouth daily. Takes 10mg  daily and if still bleeding takes another 5mg  in afternoon.    Historical Provider, MD  Assessment/Plan S/p LAPAROSCOPY OPERATIVE LOA  EXPLORATORY LAPAROTOMY REPAIR OF SIGMOID COLON SEROSA  HYSTERECTOMY ABDOMINAL LEFT SALPINGECTOMY, CYSTOSCOPY, Leighton Ruff, MD, 91/63/8466. Uterine fibroids AODM Hypertension Anemia Menstrual headaches    Plan:  Continue to mobilizie, see how she does with diet, I told her to go slow till she had a BM.  She has SCD's , no heparin or Lovenox for chemical prophylaxis.  Will defer to Dr. Charlesetta Garibaldi.    LOS: 2 days    Earnstine Regal 05/24/2013

## 2013-05-25 LAB — GLUCOSE, CAPILLARY
GLUCOSE-CAPILLARY: 186 mg/dL — AB (ref 70–99)
Glucose-Capillary: 162 mg/dL — ABNORMAL HIGH (ref 70–99)
Glucose-Capillary: 164 mg/dL — ABNORMAL HIGH (ref 70–99)
Glucose-Capillary: 176 mg/dL — ABNORMAL HIGH (ref 70–99)

## 2013-05-25 NOTE — Progress Notes (Signed)
3 Days Post-Op  Subjective: Having a bm today. Denies abdominal pain  Objective: Vital signs in last 24 hours: Temp:  [97.9 F (36.6 C)-98.6 F (37 C)] 97.9 F (36.6 C) (05/23 0355) Pulse Rate:  [85-92] 85 (05/23 0633) Resp:  [16-18] 18 (05/23 9741) BP: (103-120)/(53-66) 120/66 mmHg (05/23 0633) SpO2:  [99 %-100 %] 100 % (05/23 0633) Last BM Date: 05/22/13  Intake/Output from previous day: 05/22 0701 - 05/23 0700 In: 1200 [P.O.:1200] Out: 2218 [Urine:2200; Drains:18] Intake/Output this shift:    GI: soft, nontender  Lab Results:   Recent Labs  05/22/13 1856 05/23/13 0545  WBC 12.9* 10.4  HGB 6.5* 8.5*  HCT 21.3* 26.9*  PLT 421* 330   BMET  Recent Labs  05/23/13 0545  NA 138  K 4.2  CL 102  CO2 26  GLUCOSE 286*  BUN 5*  CREATININE 0.67  CALCIUM 8.2*   PT/INR No results found for this basename: LABPROT, INR,  in the last 72 hours ABG No results found for this basename: PHART, PCO2, PO2, HCO3,  in the last 72 hours  Studies/Results: No results found.  Anti-infectives: Anti-infectives   Start     Dose/Rate Route Frequency Ordered Stop   05/22/13 2315  ceFAZolin (ANCEF) IVPB 2 g/50 mL premix     2 g 100 mL/hr over 30 Minutes Intravenous  Once 05/22/13 2301 05/23/13 0028   05/22/13 0031  ceFAZolin (ANCEF) IVPB 2 g/50 mL premix     2 g 100 mL/hr over 30 Minutes Intravenous On call to O.R. 05/22/13 0031 05/22/13 1350      Assessment/Plan: s/p Procedure(s): LAPAROSCOPY OPERATIVE  LOA EXPLORATORY LAPAROTOMY REPAIR OF SIGMOID COLON SEROSA  (N/A) HYSTERECTOMY ABDOMINAL LEFT SALPINGECTOMY (N/A) CYSTOSCOPY (N/A) Advance diet Doing well. Will sign off  LOS: 3 days    [name] 05/25/2013

## 2013-05-25 NOTE — Discharge Summary (Signed)
Discharge Summary  [name]  DOB:    [birth year] MRN:    [id number] CSN:    [id number]  Progress note since discharge summery written by E. Powell PA-C  Pt reports 3 BM today, reports feeling well, and would like to go home.  Per Dr. Cletis Media pt may go home after BM.  Patient Active Problem List   Diagnosis Date Noted  . S/P hysterectomy 05/22/2013  . Menorrhagia 03/21/2013  . OBESITY, NOS 03/02/2006  . MIGRAINE, UNSPEC., W/O INTRACTABLE MIGRAINE 03/02/2006    Discharge Physical Exam:   General: alert, cooperative and no distress Incision: healing well DVT Evaluation: No evidence of DVT seen on physical exam.  Negative Homan's sign. JP drain left in place   Discharge Diagnoses: S/P hysterectomy  Discharge to: home  Follow-up Information   Call Newton Hamilton, MD. (Call to schedule removal of JP Drain next week and for a post operative visit in 6 weeks.)    Specialty:  Obstetrics and Gynecology   Contact information:   [address] [place] [address] [phone]        Linda Hedges 05/25/2013

## 2013-05-25 NOTE — Progress Notes (Signed)
Discharge instructions reviewed with patient.  Patient states understanding of home care.  No home equipment needed.  Reviewed JP drain care and how to empty and recharge.  Patient ambulated for discharge in stable condition with staff without incident.

## 2013-06-06 ENCOUNTER — Encounter (HOSPITAL_COMMUNITY): Payer: Self-pay | Admitting: Obstetrics and Gynecology

## 2013-07-18 ENCOUNTER — Encounter: Payer: BC Managed Care – PPO | Attending: Internal Medicine

## 2013-07-18 VITALS — Ht 62.0 in | Wt 237.0 lb

## 2013-07-18 DIAGNOSIS — Z713 Dietary counseling and surveillance: Secondary | ICD-10-CM | POA: Diagnosis present

## 2013-07-18 DIAGNOSIS — E119 Type 2 diabetes mellitus without complications: Secondary | ICD-10-CM | POA: Diagnosis present

## 2013-07-18 NOTE — Progress Notes (Signed)
Patient was seen on 07/19/2013 for the first of a series of three diabetes self-management courses at the [facility].  Current HbA1c: 8.6%  The following learning objectives were met by the patient during this class:  Describe diabetes  State some common risk factors for diabetes  Defines the role of glucose and insulin  Identifies type of diabetes and pathophysiology  Describe the relationship between diabetes and cardiovascular risk  State the members of the Healthcare Team  States the rationale for glucose monitoring  State when to test glucose  State their individual Target Range  State the importance of logging glucose readings  Describe how to interpret glucose readings  Identifies A1C target  Explain the correlation between A1c and eAG values  State symptoms and treatment of high blood glucose  State symptoms and treatment of low blood glucose  Explain proper technique for glucose testing  Identifies proper sharps disposal  Handouts given during class include:  Living Well with Diabetes book  Carb Counting and Meal Planning book  Meal Plan Card  Carbohydrate guide  Meal planning worksheet  Low Sodium Flavoring Tips  The diabetes portion plate  V4M to eAG Conversion Chart  Diabetes Medications  Diabetes Recommended Care Schedule  Support Group  Diabetes Success Plan  Core Class Satisfaction Survey  Follow-Up Plan:  Attend core 2

## 2013-07-25 DIAGNOSIS — E119 Type 2 diabetes mellitus without complications: Secondary | ICD-10-CM | POA: Diagnosis not present

## 2013-07-25 NOTE — Progress Notes (Signed)

## 2013-08-01 DIAGNOSIS — E119 Type 2 diabetes mellitus without complications: Secondary | ICD-10-CM | POA: Diagnosis not present

## 2013-08-01 NOTE — Progress Notes (Signed)
Patient was seen on 08/01/13 for the third of a series of three diabetes self-management courses at the [facility]. The following learning objectives were met by the patient during this class:    State the amount of activity recommended for healthy living   Describe activities suitable for individual needs   Identify ways to regularly incorporate activity into daily life   Identify barriers to activity and ways to over come these barriers  Identify diabetes medications being personally used and their primary action for lowering glucose and possible side effects   Describe role of stress on blood glucose and develop strategies to address psychosocial issues   Identify diabetes complications and ways to prevent them  Explain how to manage diabetes during illness   Evaluate success in meeting personal goal   Establish 2-3 goals that they will plan to diligently work on until they return for the  71-monthfollow-up visit  Goals:  Follow Diabetes Meal Plan as instructed  Aim for 15-30 mins of physical activity daily as tolerated  Bring food record and glucose log to your follow up visit  Your patient has established the following 4 month goals in their individualized success plan: I will count my carb choices at most meals and snacks To help manage stress I will do meditation at least 4 times a week  Your patient has identified these potential barriers to change:  None stated  Your patient has identified their diabetes self-care support plan as  [facility] Center[facility] Group  Family support  On- line resources  Plan:  Attend Core 4 in 4 months

## 2013-08-16 ENCOUNTER — Other Ambulatory Visit: Payer: Self-pay | Admitting: Internal Medicine

## 2013-08-16 DIAGNOSIS — N63 Unspecified lump in unspecified breast: Secondary | ICD-10-CM

## 2013-09-04 ENCOUNTER — Ambulatory Visit
Admission: RE | Admit: 2013-09-04 | Discharge: 2013-09-04 | Disposition: A | Discharge status: Home / Self Care | Payer: BC Managed Care – PPO | Source: Ambulatory Visit | Attending: Internal Medicine | Admitting: Internal Medicine

## 2013-09-04 DIAGNOSIS — N63 Unspecified lump in unspecified breast: Secondary | ICD-10-CM

## 2013-10-18 ENCOUNTER — Other Ambulatory Visit: Payer: Self-pay

## 2013-11-04 ENCOUNTER — Ambulatory Visit: Payer: BC Managed Care – PPO

## 2013-11-12 ENCOUNTER — Ambulatory Visit (INDEPENDENT_AMBULATORY_CARE_PROVIDER_SITE_OTHER): Payer: BC Managed Care – PPO

## 2013-11-12 ENCOUNTER — Ambulatory Visit (INDEPENDENT_AMBULATORY_CARE_PROVIDER_SITE_OTHER): Payer: BC Managed Care – PPO | Admitting: Podiatry

## 2013-11-12 VITALS — BP 136/87 | HR 95 | Resp 16 | Ht 63.0 in | Wt 230.0 lb

## 2013-11-12 DIAGNOSIS — M2010 Hallux valgus (acquired), unspecified foot: Secondary | ICD-10-CM

## 2013-11-12 DIAGNOSIS — L603 Nail dystrophy: Secondary | ICD-10-CM

## 2013-11-12 NOTE — Progress Notes (Signed)
   Subjective:    Patient ID: [name], female    DOB: 1970-03-20, 43 y.o.   MRN: [id number]  HPI Comments: Both big toenails are fungus, been using over the counter probelle ,bought on [place] stopped using when made appointment ,  and sometimes the joints in the big toes hurt. i am diabetic and my last a1c 6.9      Review of Systems  Endocrine:       Diabetes        Objective:   Physical Exam: I have reviewed her past medical history medications allergy surgery social history and review of systems. Pulses are strongly palpable bilateral. Neurologic sensorium is intact percent once the monofilament. Deep tendon reflexes are intact bilateral muscle strength is 5 over 5 dorsiflexion plantar flexors and inverters and everters all intrinsic musculature is intact. Orthopedic evaluation demonstrates hallux abductovalgus disorder bilaterally. These are mildly tender on range of motion and painful on palpation of the first metatarsophalangeal joint medially. Radiographic evaluation confirms an increase in the first intermetatarsal angle greater than normal values. Hallux adductus angle greater than normal values. Early osteoarthritic changes such as joint space narrowing and subchondral sclerosis. Some spurring is also noted. Cutaneous evaluation and straight supple well-hydrated cutis dorsally dry xerotic skin to the plantar aspect of the bilateral foot possibly associated with tinea pedis. Her toenails are thick yellow dystrophic and possibly mycotic.        Assessment & Plan:  Assessment: Hallux abductovalgus deformity bilateral. Tinea pedis bilateral. Onychodystrophy bilateral.  Plan: Discussed etiology pathology conservative versus surgical therapies. Discussed the need for surgical intervention.debrided nails and took samples of them today for pathologic evaluation I will follow up with her once those return. We did discuss the need for surgical intervention regarding the bunions.

## 2013-12-03 ENCOUNTER — Telehealth: Payer: Self-pay | Admitting: *Deleted

## 2013-12-03 NOTE — Telephone Encounter (Signed)
Pt asked for lab results taken 2 weeks ago.  I left message informing pt, the results could take up to 4 - 6 weeks, and we would call with instructions.

## 2013-12-19 ENCOUNTER — Encounter: Payer: Self-pay | Admitting: Podiatry

## 2013-12-19 ENCOUNTER — Ambulatory Visit (INDEPENDENT_AMBULATORY_CARE_PROVIDER_SITE_OTHER): Payer: BC Managed Care – PPO | Admitting: Podiatry

## 2013-12-19 DIAGNOSIS — Z79899 Other long term (current) drug therapy: Secondary | ICD-10-CM

## 2013-12-19 LAB — CBC WITH DIFFERENTIAL/PLATELET
BASOS PCT: 1 % (ref 0–1)
Basophils Absolute: 0.1 10*3/uL (ref 0.0–0.1)
Eosinophils Absolute: 0.2 10*3/uL (ref 0.0–0.7)
Eosinophils Relative: 3 % (ref 0–5)
HCT: 32.2 % — ABNORMAL LOW (ref 36.0–46.0)
Hemoglobin: 10.6 g/dL — ABNORMAL LOW (ref 12.0–15.0)
LYMPHS PCT: 36 % (ref 12–46)
Lymphs Abs: 2.3 10*3/uL (ref 0.7–4.0)
MCH: 24.8 pg — ABNORMAL LOW (ref 26.0–34.0)
MCHC: 32.9 g/dL (ref 30.0–36.0)
MCV: 75.4 fL — AB (ref 78.0–100.0)
MONO ABS: 0.4 10*3/uL (ref 0.1–1.0)
MONOS PCT: 6 % (ref 3–12)
MPV: 9.7 fL (ref 9.4–12.4)
NEUTROS ABS: 3.5 10*3/uL (ref 1.7–7.7)
Neutrophils Relative %: 54 % (ref 43–77)
Platelets: 384 10*3/uL (ref 150–400)
RBC: 4.27 MIL/uL (ref 3.87–5.11)
RDW: 17.6 % — AB (ref 11.5–15.5)
WBC: 6.5 10*3/uL (ref 4.0–10.5)

## 2013-12-19 MED ORDER — TERBINAFINE HCL 250 MG PO TABS: 250.0000 mg | ORAL_TABLET | Freq: Every day | ORAL | Status: DC

## 2013-12-20 LAB — HEPATIC FUNCTION PANEL
ALT: 17 U/L (ref 0–35)
AST: 12 U/L (ref 0–37)
Albumin: 3.9 g/dL (ref 3.5–5.2)
Alkaline Phosphatase: 83 U/L (ref 39–117)
Total Bilirubin: 0.2 mg/dL (ref 0.2–1.2)
Total Protein: 7.3 g/dL (ref 6.0–8.3)

## 2013-12-21 NOTE — Progress Notes (Signed)
She presents today for follow-up of her pathology report for her fungal toenails.  Objective: Pathology report was positive for onychomycosis.  Assessment: Onychomycosis bilateral.  Plan: Started her on Lamisil 250 mg tablets 1 by mouth daily 30. CBC and liver profile was performed. Follow-up with her in 1 month.

## 2013-12-23 ENCOUNTER — Other Ambulatory Visit: Payer: Self-pay | Admitting: Obstetrics and Gynecology

## 2013-12-23 DIAGNOSIS — R109 Unspecified abdominal pain: Secondary | ICD-10-CM

## 2013-12-24 ENCOUNTER — Telehealth: Payer: Self-pay | Admitting: *Deleted

## 2013-12-24 NOTE — Telephone Encounter (Signed)
I called and informed the patient that Dr. Milinda Pointer said labwork was okay.  "Okay, thank you."

## 2013-12-24 NOTE — Telephone Encounter (Signed)
-----   Message from Garrel Ridgel, [place] sent at 12/21/2013  4:10 PM EST ----- Liver profile looks good and may continue medication.

## 2013-12-24 NOTE — Telephone Encounter (Signed)
Left message regarding blood work. Ok continue with medication

## 2013-12-25 ENCOUNTER — Ambulatory Visit
Admission: RE | Admit: 2013-12-25 | Discharge: 2013-12-25 | Disposition: A | Discharge status: Home / Self Care | Payer: BC Managed Care – PPO | Source: Ambulatory Visit | Attending: Obstetrics and Gynecology | Admitting: Obstetrics and Gynecology

## 2013-12-25 DIAGNOSIS — R109 Unspecified abdominal pain: Secondary | ICD-10-CM

## 2013-12-25 MED ORDER — IOHEXOL 300 MG/ML  SOLN
125.0000 mL | Freq: Once | INTRAMUSCULAR | Status: AC | PRN
  Administered 2013-12-25: 125 mL via INTRAVENOUS

## 2014-01-15 ENCOUNTER — Encounter: Payer: Self-pay | Admitting: Podiatry

## 2014-01-16 ENCOUNTER — Ambulatory Visit (INDEPENDENT_AMBULATORY_CARE_PROVIDER_SITE_OTHER): Payer: BLUE CROSS/BLUE SHIELD | Admitting: Podiatry

## 2014-01-16 ENCOUNTER — Encounter: Payer: Self-pay | Admitting: Podiatry

## 2014-01-16 VITALS — BP 143/96 | HR 93 | Resp 12

## 2014-01-16 DIAGNOSIS — Z79899 Other long term (current) drug therapy: Secondary | ICD-10-CM

## 2014-01-16 MED ORDER — TERBINAFINE HCL 250 MG PO TABS: 250.0000 mg | ORAL_TABLET | Freq: Every day | ORAL | Status: DC

## 2014-01-17 NOTE — Progress Notes (Signed)
She presents today one month status post starting her Lamisil therapy for her onychomycosis and tinea pedis. She denies fever chills nausea vomiting muscle aches pains itching or rashing. She states that the rash on the plantar aspect of the bilateral foot has resolved and feels much better between her toes. No nail changes as of yet.  Objective: Vital signs are stable she is alert and oriented 3. Pulses are strongly palpable bilateral. Nails are thick yellow dystrophic and clinically mycotic. No signs of tinea pedis plantar aspect of the bilateral foot.  Assessment: Long-term therapy with use of Lamisil for onychomycosis and tinea pedis.  Plan: Secondary to concomitant use of valacyclovir and other liver metabolized medications we are requesting more blood work consisting of a CBC and liver profile. 90 day prescription for terbinafine was prescribed. I will follow-up with her in 4 months should her blood work back abnormal we will notify her immediately.

## 2014-02-05 ENCOUNTER — Other Ambulatory Visit: Payer: Self-pay | Admitting: Internal Medicine

## 2014-02-05 DIAGNOSIS — N649 Disorder of breast, unspecified: Secondary | ICD-10-CM

## 2014-02-05 DIAGNOSIS — N63 Unspecified lump in unspecified breast: Secondary | ICD-10-CM

## 2014-02-12 ENCOUNTER — Ambulatory Visit (INDEPENDENT_AMBULATORY_CARE_PROVIDER_SITE_OTHER): Payer: Self-pay | Admitting: Surgery

## 2014-02-12 DIAGNOSIS — K439 Ventral hernia without obstruction or gangrene: Secondary | ICD-10-CM

## 2014-02-12 NOTE — H&P (Signed)
Chief Complaint:  Ventral incisional hernia  History of Present Illness:  [name] is an 44 y.o. female Who underwent a hysterectomy last May at which time there was a colonic injury.  Dr. Dalbert Batman came in and helped.  Postoperatively she developed an incisional hernia.  This was best seen on the CT scan that shows small bowel loops in the hernia.  She was referred by her employer, [name] to me for repair.  I saw her in the office and discussed laparoscopic/open repair with mesh.  Patient understands and accepts risks.  Past Medical History  Diagnosis Date  . Fibroids   . Hypertension   . Anemia   . SVD (spontaneous vaginal delivery)     x 3  . Diabetes mellitus without complication     type 2  . Headache(784.0)     otc med prn  . History of blood transfusion 03/2013    [place] - 2 units transfued  . Hyperlipidemia   . Obesity     Past Surgical History  Procedure Laterality Date  . Salpingectomy Right 2005    adb insicion  . Tubal ligation    . Laparoscopy  05/22/2013    Procedure: LAPAROSCOPY OPERATIVE  LOA;  Surgeon: Betsy Coder, MD;  Location: [facility];  Service: Gynecology;;  . Laparotomy N/A 05/22/2013    Procedure: EXPLORATORY LAPAROTOMY REPAIR OF SIGMOID COLON SEROSA ;  Surgeon: Betsy Coder, MD;  Location: [facility];  Service: Gynecology;  Laterality: N/A;  . Abdominal hysterectomy N/A 05/22/2013    Procedure: HYSTERECTOMY ABDOMINAL LEFT SALPINGECTOMY;  Surgeon: Betsy Coder, MD;  Location: [facility];  Service: Gynecology;  Laterality: N/A;  . Cystoscopy N/A 05/22/2013    Procedure: CYSTOSCOPY;  Surgeon: Betsy Coder, MD;  Location: [facility];  Service: Gynecology;  Laterality: N/A;    Current Outpatient Prescriptions  Medication Sig Dispense Refill  . Cholecalciferol (VITAMIN D PO) Take by mouth.    . Fe Cbn-Fe Gluc-FA-B12-C-DSS (FERRALET 90 PO) Take 90 mg by mouth daily.    . ferrous sulfate 325 (65 FE) MG tablet Take 325 mg by mouth 2 (two) times daily  with a meal.     . HYDROcodone-acetaminophen (NORCO/VICODIN) 5-325 MG per tablet Take 1-2 tablets by mouth every 6 (six) hours as needed for moderate pain. 30 tablet 0  . ibuprofen (ADVIL,MOTRIN) 600 MG tablet 1  po   pc  every 6 hours for 5 days then prn-pain 30 tablet 1  . Liraglutide (VICTOZA [place]) Inject into the skin.    Marland Kitchen lisinopril-hydrochlorothiazide (PRINZIDE,ZESTORETIC) 10-12.5 MG per tablet Take 1 tablet by mouth daily.    . metFORMIN (GLUCOPHAGE) 500 MG tablet Take 500 mg by mouth 2 (two) times daily with a meal.    . metFORMIN (GLUCOPHAGE) 850 MG tablet Take 1 tablet (850 mg total) by mouth 2 (two) times daily with a meal. 14 tablet 0  . terbinafine (LAMISIL) 250 MG tablet Take 1 tablet (250 mg total) by mouth daily. 30 tablet 0  . terbinafine (LAMISIL) 250 MG tablet Take 1 tablet (250 mg total) by mouth daily. 90 tablet 0  . valACYclovir (VALTREX) 500 MG tablet   0   No current facility-administered medications for this visit.   Review of patient's allergies indicates no known allergies. No family history on file. Social History:   reports that she has never smoked. She has never used smokeless tobacco. She reports that she drinks alcohol. She reports that she  does not use illicit drugs.   REVIEW OF SYSTEMS : Negative except for diabetes mellitus  Physical Exam:   Last menstrual period 03/19/2013. There is no weight on file to calculate BMI.  Gen:  WDWN African-American female NAD  Neurological: Alert and oriented to person, place, and time. Motor and sensory function is grossly intact  Head: Normocephalic and atraumatic.  Eyes: Conjunctivae are normal. Pupils are equal, round, and reactive to light. No scleral icterus.  Neck: Normal range of motion. Neck supple. No tracheal deviation or thyromegaly present.  Cardiovascular:  SR without murmurs or gallops.  No carotid bruits Breast:  Not examined Respiratory: Effort normal.  No respiratory distress. No chest wall  tenderness. Breath sounds normal.  No wheezes, rales or rhonchi.  Abdomen:  Soft squishy hernia to the left of midline below the umbilicus consistent with a ventral hernia in the upper most portion of a Pfannenstiel incision. GU:  Not examined Musculoskeletal: Normal range of motion. Extremities are nontender. No cyanosis, edema or clubbing noted Lymphadenopathy: No cervical, preauricular, postauricular or axillary adenopathy is present Skin: Skin is warm and dry. No rash noted. No diaphoresis. No erythema. No pallor. Pscyh: Normal mood and affect. Behavior is normal. Judgment and thought content normal.   LABORATORY RESULTS: No results found for this or any previous visit (from the past 48 hour(s)).   RADIOLOGY RESULTS: No results found.  Problem List: Patient Active Problem List   Diagnosis Date Noted  . Ventral hernia 02/12/2014  . S/P hysterectomy 05/22/2013  . Menorrhagia 03/21/2013  . OBESITY, NOS 03/02/2006  . MIGRAINE, UNSPEC., W/O INTRACTABLE MIGRAINE 03/02/2006    Assessment & Plan: Ventral hernia.  Plan laparoscopically assisted hernia repair with mesh.    Matt B. Hassell Done, MD, [facility] [phone] beeper [phone]  02/12/2014 11:21 AM

## 2014-03-06 ENCOUNTER — Other Ambulatory Visit: Payer: Self-pay

## 2014-03-20 NOTE — Patient Instructions (Addendum)
20 [name]  03/20/2014   Your procedure is scheduled on:   04-02-2014 Monday  Enter through [facility]  Entrance and follow signs to [facility]. Arrive at     0800   AM / PM.  Call this number if you have problems the morning of surgery: [phone]  Or Presurgical Testing [phone].   For Living Will and/or Health Care Power Attorney Forms: please provide copy for your medical record,may bring AM of surgery(Forms should be already notarized -we do not provide this service).(03-21-14  No information preferred today).  Remember: Follow any bowel prep instructions per MD office.(Fleet enema noted in Epic order will be readdressed by pt with MD office).   Do not eat food/ or drink: After Midnight.      Take these medicines the morning of surgery with A SIP OF WATER: Allegra(if need). Victoza( 1/2 usual PM dose) night before.  No Diabetic meds AM of.   Do not wear jewelry, make-up or nail polish.  Do not wear deodorant, lotions, powders, or perfumes.   Do not shave legs and under arms- 48 hours(2 days) prior to first CHG shower.(Shaving face and neck okay.)  Do not bring valuables to the hospital.(Hospital is not responsible for lost valuables).  Contacts, dentures or removable bridgework, body piercing, hair pins may not be worn into surgery.  Leave suitcase in the car. After surgery it may be brought to your room.  For patients admitted to the hospital, checkout time is 11:00 AM the day of discharge.(Restricted visitors-Any Persons displaying flu-like symptoms or illness).    Patients discharged the day of surgery will not be allowed to drive home. Must have responsible person with you x 24 hours once discharged.  Name and phone number of your driver: [name], son [phone] cell     Please read over the following fact sheets that you were given:  CHG(Chlorhexidine Gluconate 4% Surgical Soap) use.         [place] - Preparing for Surgery Before surgery,  you can play an important role.  Because skin is not sterile, your skin needs to be as free of germs as possible.  You can reduce the number of germs on your skin by washing with CHG (chlorahexidine gluconate) soap before surgery.  CHG is an antiseptic cleaner which kills germs and bonds with the skin to continue killing germs even after washing. Please DO NOT use if you have an allergy to CHG or antibacterial soaps.  If your skin becomes reddened/irritated stop using the CHG and inform your nurse when you arrive at Short Stay. Do not shave (including legs and underarms) for at least 48 hours prior to the first CHG shower.  You may shave your face/neck. Please follow these instructions carefully:  1.  Shower with CHG Soap the night before surgery and the  morning of Surgery.  2.  If you choose to wash your hair, wash your hair first as usual with your  normal  shampoo.  3.  After you shampoo, rinse your hair and body thoroughly to remove the  shampoo.                           4.  Use CHG as you would any other liquid soap.  You can apply chg directly  to the skin and wash  Gently with a scrungie or clean washcloth.  5.  Apply the CHG Soap to your body ONLY FROM THE NECK DOWN.   Do not use on face/ open                           Wound or open sores. Avoid contact with eyes, ears mouth and genitals (private parts).                       Wash face,  Genitals (private parts) with your normal soap.             6.  Wash thoroughly, paying special attention to the area where your surgery  will be performed.  7.  Thoroughly rinse your body with warm water from the neck down.  8.  DO NOT shower/wash with your normal soap after using and rinsing off  the CHG Soap.                9.  Pat yourself dry with a clean towel.            10.  Wear clean pajamas.            11.  Place clean sheets on your bed the night of your first shower and do not  sleep with pets. Day of Surgery : Do not  apply any lotions/deodorants the morning of surgery.  Please wear clean clothes to the hospital/surgery center.  FAILURE TO FOLLOW THESE INSTRUCTIONS MAY RESULT IN THE CANCELLATION OF YOUR SURGERY PATIENT SIGNATURE_________________________________  NURSE SIGNATURE__________________________________  ________________________________________________________________________

## 2014-03-21 ENCOUNTER — Encounter (HOSPITAL_COMMUNITY)
Admission: RE | Admit: 2014-03-21 | Discharge: 2014-03-21 | Disposition: A | Discharge status: Home / Self Care | Payer: BLUE CROSS/BLUE SHIELD | Source: Ambulatory Visit | Attending: Surgery | Admitting: Surgery

## 2014-03-21 ENCOUNTER — Encounter (HOSPITAL_COMMUNITY): Payer: Self-pay

## 2014-03-21 DIAGNOSIS — K439 Ventral hernia without obstruction or gangrene: Secondary | ICD-10-CM | POA: Insufficient documentation

## 2014-03-21 DIAGNOSIS — Z01812 Encounter for preprocedural laboratory examination: Secondary | ICD-10-CM | POA: Diagnosis not present

## 2014-03-21 LAB — BASIC METABOLIC PANEL
Anion gap: 7 (ref 5–15)
BUN: 13 mg/dL (ref 6–23)
CHLORIDE: 104 mmol/L (ref 96–112)
CO2: 28 mmol/L (ref 19–32)
Calcium: 9.8 mg/dL (ref 8.4–10.5)
Creatinine, Ser: 0.63 mg/dL (ref 0.50–1.10)
GFR calc non Af Amer: >90 mL/min (ref >90)
Glucose, Bld: 125 mg/dL — ABNORMAL HIGH (ref 70–99)
POTASSIUM: 4.4 mmol/L (ref 3.5–5.1)
Sodium: 139 mmol/L (ref 135–145)

## 2014-03-21 LAB — CBC
HCT: 34.6 % — ABNORMAL LOW (ref 36.0–46.0)
HEMOGLOBIN: 10.7 g/dL — AB (ref 12.0–15.0)
MCH: 24.7 pg — ABNORMAL LOW (ref 26.0–34.0)
MCHC: 30.9 g/dL (ref 30.0–36.0)
MCV: 79.9 fL (ref 78.0–100.0)
PLATELETS: 373 10*3/uL (ref 150–400)
RBC: 4.33 MIL/uL (ref 3.87–5.11)
RDW: 17.7 % — ABNORMAL HIGH (ref 11.5–15.5)
WBC: 5.7 10*3/uL (ref 4.0–10.5)

## 2014-03-21 NOTE — Pre-Procedure Instructions (Signed)
03-21-14 EKG 5'15 with chart.

## 2014-03-28 ENCOUNTER — Ambulatory Visit
Admission: RE | Admit: 2014-03-28 | Discharge: 2014-03-28 | Disposition: A | Discharge status: Home / Self Care | Payer: BLUE CROSS/BLUE SHIELD | Source: Ambulatory Visit | Attending: Internal Medicine | Admitting: Internal Medicine

## 2014-03-28 DIAGNOSIS — N649 Disorder of breast, unspecified: Secondary | ICD-10-CM

## 2014-04-02 ENCOUNTER — Encounter (HOSPITAL_COMMUNITY)
Admission: RE | Disposition: A | Discharge status: Home / Self Care | Payer: Self-pay | Source: Ambulatory Visit | Attending: Surgery

## 2014-04-02 ENCOUNTER — Ambulatory Visit (HOSPITAL_COMMUNITY): Payer: BLUE CROSS/BLUE SHIELD | Admitting: Anesthesiology

## 2014-04-02 ENCOUNTER — Encounter (HOSPITAL_COMMUNITY): Payer: Self-pay | Admitting: *Deleted

## 2014-04-02 ENCOUNTER — Observation Stay (HOSPITAL_COMMUNITY)
Admission: RE | Admit: 2014-04-02 | Discharge: 2014-04-03 | Disposition: A | Discharge status: Home / Self Care | Payer: BLUE CROSS/BLUE SHIELD | Source: Ambulatory Visit | Attending: Surgery | Admitting: Surgery

## 2014-04-02 DIAGNOSIS — Z791 Long term (current) use of non-steroidal anti-inflammatories (NSAID): Secondary | ICD-10-CM | POA: Diagnosis not present

## 2014-04-02 DIAGNOSIS — E119 Type 2 diabetes mellitus without complications: Secondary | ICD-10-CM | POA: Diagnosis not present

## 2014-04-02 DIAGNOSIS — Z79891 Long term (current) use of opiate analgesic: Secondary | ICD-10-CM | POA: Diagnosis not present

## 2014-04-02 DIAGNOSIS — E785 Hyperlipidemia, unspecified: Secondary | ICD-10-CM | POA: Insufficient documentation

## 2014-04-02 DIAGNOSIS — K439 Ventral hernia without obstruction or gangrene: Principal | ICD-10-CM | POA: Insufficient documentation

## 2014-04-02 DIAGNOSIS — D649 Anemia, unspecified: Secondary | ICD-10-CM | POA: Insufficient documentation

## 2014-04-02 DIAGNOSIS — E669 Obesity, unspecified: Secondary | ICD-10-CM | POA: Insufficient documentation

## 2014-04-02 DIAGNOSIS — I10 Essential (primary) hypertension: Secondary | ICD-10-CM | POA: Insufficient documentation

## 2014-04-02 DIAGNOSIS — Z6841 Body Mass Index (BMI) 40.0 and over, adult: Secondary | ICD-10-CM | POA: Diagnosis not present

## 2014-04-02 DIAGNOSIS — K388 Other specified diseases of appendix: Secondary | ICD-10-CM | POA: Insufficient documentation

## 2014-04-02 DIAGNOSIS — Z79899 Other long term (current) drug therapy: Secondary | ICD-10-CM | POA: Insufficient documentation

## 2014-04-02 DIAGNOSIS — Z9889 Other specified postprocedural states: Secondary | ICD-10-CM

## 2014-04-02 DIAGNOSIS — Z8719 Personal history of other diseases of the digestive system: Secondary | ICD-10-CM

## 2014-04-02 HISTORY — PX: APPENDECTOMY: SHX54

## 2014-04-02 HISTORY — PX: VENTRAL HERNIA REPAIR: SHX424

## 2014-04-02 LAB — CBC
HCT: 32.5 % — ABNORMAL LOW (ref 36.0–46.0)
HEMOGLOBIN: 10.2 g/dL — AB (ref 12.0–15.0)
MCH: 24.9 pg — AB (ref 26.0–34.0)
MCHC: 31.4 g/dL (ref 30.0–36.0)
MCV: 79.5 fL (ref 78.0–100.0)
PLATELETS: 304 10*3/uL (ref 150–400)
RBC: 4.09 MIL/uL (ref 3.87–5.11)
RDW: 17.5 % — ABNORMAL HIGH (ref 11.5–15.5)
WBC: 6.2 10*3/uL (ref 4.0–10.5)

## 2014-04-02 LAB — GLUCOSE, CAPILLARY
GLUCOSE-CAPILLARY: 144 mg/dL — AB (ref 70–99)
GLUCOSE-CAPILLARY: 155 mg/dL — AB (ref 70–99)
Glucose-Capillary: 147 mg/dL — ABNORMAL HIGH (ref 70–99)
Glucose-Capillary: 117 mg/dL — ABNORMAL HIGH (ref 70–99)
Glucose-Capillary: 147 mg/dL — ABNORMAL HIGH (ref 70–99)

## 2014-04-02 LAB — CREATININE, SERUM
Creatinine, Ser: 0.94 mg/dL (ref 0.50–1.10)
GFR, EST AFRICAN AMERICAN: 83 mL/min — AB (ref >90)
GFR, EST NON AFRICAN AMERICAN: 72 mL/min — AB (ref >90)

## 2014-04-02 SURGERY — REPAIR, HERNIA, VENTRAL, LAPAROSCOPIC
Anesthesia: General | Site: Abdomen

## 2014-04-02 MED ORDER — NEOSTIGMINE METHYLSULFATE 10 MG/10ML IV SOLN
INTRAVENOUS | Status: DC | PRN
  Administered 2014-04-02: 5 mg via INTRAVENOUS

## 2014-04-02 MED ORDER — DEXTROSE 5 % IV SOLN
INTRAVENOUS | Status: AC
  Filled 2014-04-02: qty 2

## 2014-04-02 MED ORDER — BUPIVACAINE-EPINEPHRINE 0.25% -1:200000 IJ SOLN
INTRAMUSCULAR | Status: AC
  Filled 2014-04-02: qty 1

## 2014-04-02 MED ORDER — KCL IN DEXTROSE-NACL 20-5-0.45 MEQ/L-%-% IV SOLN
INTRAVENOUS | Status: DC
  Administered 2014-04-02: 17:00:00 via INTRAVENOUS
  Administered 2014-04-03: 100 mL/h via INTRAVENOUS
  Filled 2014-04-02 (×3): qty 1000

## 2014-04-02 MED ORDER — FENTANYL CITRATE 0.05 MG/ML IJ SOLN
INTRAMUSCULAR | Status: AC
  Filled 2014-04-02: qty 5

## 2014-04-02 MED ORDER — CHLORHEXIDINE GLUCONATE 4 % EX LIQD: 1.0000 "application " | Freq: Once | CUTANEOUS | Status: DC

## 2014-04-02 MED ORDER — GLYCOPYRROLATE 0.2 MG/ML IJ SOLN
INTRAMUSCULAR | Status: DC | PRN
  Administered 2014-04-02: 0.2 mg via INTRAVENOUS
  Administered 2014-04-02: .8 mg via INTRAVENOUS

## 2014-04-02 MED ORDER — ONDANSETRON HCL 4 MG/2ML IJ SOLN
INTRAMUSCULAR | Status: AC
  Filled 2014-04-02: qty 2

## 2014-04-02 MED ORDER — SODIUM CHLORIDE 0.9 % IV SOLN
INTRAVENOUS | Status: DC | PRN
  Administered 2014-04-02: 10 mL via INTRAMUSCULAR

## 2014-04-02 MED ORDER — HYDROMORPHONE HCL 2 MG/ML IJ SOLN
INTRAMUSCULAR | Status: AC
  Filled 2014-04-02: qty 1

## 2014-04-02 MED ORDER — PROPOFOL 10 MG/ML IV BOLUS
INTRAVENOUS | Status: AC
  Filled 2014-04-02: qty 20

## 2014-04-02 MED ORDER — DEXTROSE 5 % IV SOLN
2.0000 g | INTRAVENOUS | Status: AC
  Administered 2014-04-02 (×2): 2 g via INTRAVENOUS

## 2014-04-02 MED ORDER — INSULIN ASPART 100 UNIT/ML ~~LOC~~ SOLN
0.0000 [IU] | SUBCUTANEOUS | Status: DC
  Administered 2014-04-02 – 2014-04-03 (×3): 3 [IU] via SUBCUTANEOUS

## 2014-04-02 MED ORDER — PROMETHAZINE HCL 25 MG/ML IJ SOLN
6.2500 mg | INTRAMUSCULAR | Status: DC | PRN
Start: 2014-04-02 — End: 2014-04-02

## 2014-04-02 MED ORDER — ROCURONIUM BROMIDE 100 MG/10ML IV SOLN
INTRAVENOUS | Status: DC | PRN
  Administered 2014-04-02: 15 mg via INTRAVENOUS
  Administered 2014-04-02: 50 mg via INTRAVENOUS

## 2014-04-02 MED ORDER — FENTANYL CITRATE 0.05 MG/ML IJ SOLN
INTRAMUSCULAR | Status: AC
  Filled 2014-04-02: qty 2

## 2014-04-02 MED ORDER — LACTATED RINGERS IV SOLN
INTRAVENOUS | Status: DC
Start: 2014-04-02 — End: 2014-04-02
  Administered 2014-04-02: 1000 mL via INTRAVENOUS

## 2014-04-02 MED ORDER — HEPARIN SODIUM (PORCINE) 5000 UNIT/ML IJ SOLN
5000.0000 [IU] | Freq: Three times a day (TID) | INTRAMUSCULAR | Status: DC
  Administered 2014-04-02 – 2014-04-03 (×3): 5000 [IU] via SUBCUTANEOUS
  Filled 2014-04-02 (×6): qty 1

## 2014-04-02 MED ORDER — ONDANSETRON HCL 4 MG PO TABS: 4.0000 mg | ORAL_TABLET | Freq: Four times a day (QID) | ORAL | Status: DC | PRN

## 2014-04-02 MED ORDER — LACTATED RINGERS IV SOLN
INTRAVENOUS | Status: DC
  Administered 2014-04-02: 13:00:00 via INTRAVENOUS

## 2014-04-02 MED ORDER — MEPERIDINE HCL 50 MG/ML IJ SOLN: 6.2500 mg | INTRAMUSCULAR | Status: DC | PRN

## 2014-04-02 MED ORDER — HEPARIN SODIUM (PORCINE) 5000 UNIT/ML IJ SOLN
5000.0000 [IU] | Freq: Once | INTRAMUSCULAR | Status: AC
  Administered 2014-04-02: 5000 [IU] via SUBCUTANEOUS
  Filled 2014-04-02: qty 1

## 2014-04-02 MED ORDER — LIDOCAINE HCL (CARDIAC) 20 MG/ML IV SOLN
INTRAVENOUS | Status: AC
  Filled 2014-04-02: qty 5

## 2014-04-02 MED ORDER — PROPOFOL 10 MG/ML IV BOLUS
INTRAVENOUS | Status: DC | PRN
  Administered 2014-04-02: 200 mg via INTRAVENOUS

## 2014-04-02 MED ORDER — LIDOCAINE HCL (CARDIAC) 20 MG/ML IV SOLN
INTRAVENOUS | Status: DC | PRN
  Administered 2014-04-02: 80 mg via INTRAVENOUS

## 2014-04-02 MED ORDER — FENTANYL CITRATE 0.05 MG/ML IJ SOLN
25.0000 ug | INTRAMUSCULAR | Status: DC | PRN
  Administered 2014-04-02 (×2): 50 ug via INTRAVENOUS

## 2014-04-02 MED ORDER — SODIUM CHLORIDE 0.9 % IJ SOLN
INTRAMUSCULAR | Status: AC
  Filled 2014-04-02: qty 50

## 2014-04-02 MED ORDER — BUPIVACAINE LIPOSOME 1.3 % IJ SUSP
20.0000 mL | Freq: Once | INTRAMUSCULAR | Status: AC
  Administered 2014-04-02: 20 mL
  Filled 2014-04-02: qty 20

## 2014-04-02 MED ORDER — HYDROMORPHONE HCL 1 MG/ML IJ SOLN
INTRAMUSCULAR | Status: DC | PRN
  Administered 2014-04-02: 0.5 mg via INTRAVENOUS

## 2014-04-02 MED ORDER — EPHEDRINE SULFATE 50 MG/ML IJ SOLN
INTRAMUSCULAR | Status: DC | PRN
  Administered 2014-04-02: 5 mg via INTRAVENOUS
  Administered 2014-04-02: 10 mg via INTRAVENOUS

## 2014-04-02 MED ORDER — METOCLOPRAMIDE HCL 5 MG/ML IJ SOLN
INTRAMUSCULAR | Status: DC | PRN
  Administered 2014-04-02: 10 mg via INTRAVENOUS

## 2014-04-02 MED ORDER — OXYCODONE-ACETAMINOPHEN 5-325 MG PO TABS
1.0000 | ORAL_TABLET | ORAL | Status: DC | PRN
  Administered 2014-04-02 – 2014-04-03 (×4): 1 via ORAL
  Filled 2014-04-02 (×4): qty 1

## 2014-04-02 MED ORDER — MIDAZOLAM HCL 2 MG/2ML IJ SOLN
INTRAMUSCULAR | Status: AC
  Filled 2014-04-02: qty 2

## 2014-04-02 MED ORDER — SODIUM CHLORIDE 0.9 % IJ SOLN
INTRAMUSCULAR | Status: AC
  Filled 2014-04-02: qty 10

## 2014-04-02 MED ORDER — MIDAZOLAM HCL 5 MG/5ML IJ SOLN
INTRAMUSCULAR | Status: DC | PRN
  Administered 2014-04-02: 2 mg via INTRAVENOUS

## 2014-04-02 MED ORDER — MORPHINE SULFATE 2 MG/ML IJ SOLN
1.0000 mg | INTRAMUSCULAR | Status: DC | PRN
Start: 2014-04-02 — End: 2014-04-03
  Administered 2014-04-02: 1 mg via INTRAVENOUS
  Filled 2014-04-02: qty 1

## 2014-04-02 MED ORDER — CEFOXITIN SODIUM 1 G IV SOLR
1.0000 g | Freq: Four times a day (QID) | INTRAVENOUS | Status: AC
  Administered 2014-04-02 – 2014-04-03 (×3): 1 g via INTRAVENOUS
  Filled 2014-04-02 (×3): qty 1

## 2014-04-02 MED ORDER — ONDANSETRON HCL 4 MG/2ML IJ SOLN
INTRAMUSCULAR | Status: DC | PRN
  Administered 2014-04-02: 4 mg via INTRAVENOUS

## 2014-04-02 MED ORDER — FENTANYL CITRATE 0.05 MG/ML IJ SOLN
INTRAMUSCULAR | Status: DC | PRN
  Administered 2014-04-02 (×3): 50 ug via INTRAVENOUS
  Administered 2014-04-02: 100 ug via INTRAVENOUS
  Administered 2014-04-02 (×2): 50 ug via INTRAVENOUS

## 2014-04-02 MED ORDER — ONDANSETRON HCL 4 MG/2ML IJ SOLN: 4.0000 mg | Freq: Four times a day (QID) | INTRAMUSCULAR | Status: DC | PRN

## 2014-04-02 MED ORDER — ROCURONIUM BROMIDE 100 MG/10ML IV SOLN
INTRAVENOUS | Status: AC
  Filled 2014-04-02: qty 1

## 2014-04-02 SURGICAL SUPPLY — 49 items
APL SKNCLS STERI-STRIP NONHPOA (GAUZE/BANDAGES/DRESSINGS)
BAG SPEC RTRVL LRG 6X4 10 (ENDOMECHANICALS) ×2
BENZOIN TINCTURE PRP APPL 2/3 (GAUZE/BANDAGES/DRESSINGS) IMPLANT
BINDER ABDOMINAL 12 ML 46-62 (SOFTGOODS) ×1 IMPLANT
CUTTER FLEX LINEAR 45M (STAPLE) ×1 IMPLANT
DECANTER SPIKE VIAL GLASS SM (MISCELLANEOUS) ×2 IMPLANT
DEVICE SECURE STRAP 25 ABSORB (INSTRUMENTS) IMPLANT
DEVICE TROCAR PUNCTURE CLOSURE (ENDOMECHANICALS) ×2 IMPLANT
DISSECTOR BLUNT TIP ENDO 5MM (MISCELLANEOUS) IMPLANT
DRAIN CHANNEL 19F RND (DRAIN) IMPLANT
DRAPE LAPAROSCOPIC ABDOMINAL (DRAPES) ×2 IMPLANT
ELECT CAUTERY BLADE 6.4 (BLADE) ×2 IMPLANT
ELECT REM PT RETURN 9FT ADLT (ELECTROSURGICAL) ×2
ELECTRODE REM PT RTRN 9FT ADLT (ELECTROSURGICAL) ×1 IMPLANT
EVACUATOR SILICONE 100CC (DRAIN) IMPLANT
GLOVE BIOGEL M 8.0 STRL (GLOVE) ×2 IMPLANT
GOWN STRL REUS W/TWL XL LVL3 (GOWN DISPOSABLE) ×6 IMPLANT
KIT BASIN OR (CUSTOM PROCEDURE TRAY) ×2 IMPLANT
LIQUID BAND (GAUZE/BANDAGES/DRESSINGS) ×2 IMPLANT
NDL SPNL 22GX3.5 QUINCKE BK (NEEDLE) ×1 IMPLANT
NEEDLE SPNL 22GX3.5 QUINCKE BK (NEEDLE) ×2 IMPLANT
PEN SKIN MARKING BROAD (MISCELLANEOUS) ×2 IMPLANT
PENCIL BUTTON HOLSTER BLD 10FT (ELECTRODE) ×2 IMPLANT
POUCH SPECIMEN RETRIEVAL 10MM (ENDOMECHANICALS) ×2 IMPLANT
RELOAD 45 VASCULAR/THIN (ENDOMECHANICALS) ×4 IMPLANT
RELOAD STAPLE 45 2.5 WHT GRN (ENDOMECHANICALS) IMPLANT
RELOAD STAPLER LINEAR PROX 30 (STAPLE) ×1 IMPLANT
SCRUB PCMX 4 OZ (MISCELLANEOUS) ×2 IMPLANT
SET IRRIG TUBING LAPAROSCOPIC (IRRIGATION / IRRIGATOR) IMPLANT
SHEARS CURVED HARMONIC AC 45CM (MISCELLANEOUS) IMPLANT
SLEEVE XCEL OPT CAN 5 100 (ENDOMECHANICALS) IMPLANT
SPONGE LAP 18X18 X RAY DECT (DISPOSABLE) ×1 IMPLANT
STAPLER RELOAD LINEAR PROX 30 (STAPLE) ×2
STAPLER RELOADABLE 30 BLU REG (STAPLE) IMPLANT
STAPLER VISISTAT 35W (STAPLE) ×2 IMPLANT
STRIP CLOSURE SKIN 1/2X4 (GAUZE/BANDAGES/DRESSINGS) IMPLANT
SUT NOVA 0 T19/GS 22DT (SUTURE) IMPLANT
SUT NOVA 1 T20/GS 25DT (SUTURE) ×3 IMPLANT
SUT PROLENE 0 CT 1 CR/8 (SUTURE) IMPLANT
SUT VIC AB 4-0 SH 18 (SUTURE) ×2 IMPLANT
TACKER 5MM HERNIA 3.5CML NAB (ENDOMECHANICALS) IMPLANT
TOWEL OR 17X26 10 PK STRL BLUE (TOWEL DISPOSABLE) ×2 IMPLANT
TOWEL OR NON WOVEN STRL DISP B (DISPOSABLE) ×2 IMPLANT
TRAY FOLEY CATH 14FRSI W/METER (CATHETERS) ×1 IMPLANT
TRAY LAPAROSCOPIC (CUSTOM PROCEDURE TRAY) ×2 IMPLANT
TROCAR BLADELESS OPT 5 100 (ENDOMECHANICALS) ×3 IMPLANT
TROCAR XCEL 12X100 BLDLESS (ENDOMECHANICALS) ×1 IMPLANT
TROCAR XCEL NON-BLD 11X100MML (ENDOMECHANICALS) IMPLANT
YANKAUER SUCT BULB TIP 10FT TU (MISCELLANEOUS) ×1 IMPLANT

## 2014-04-02 NOTE — Brief Op Note (Signed)
04/02/2014  12:27 PM  PATIENT:  [name]  44 y.o. female  PRE-OPERATIVE DIAGNOSIS:  VENTRAL HERNIA  POST-OPERATIVE DIAGNOSIS:  VENTRAL HERNIA  PROCEDURE:  Procedure(s): LAPAROSCOPIC VENTRAL HERNIA REPAIR (N/A) APPENDECTOMY (N/A)  SURGEON:  Surgeon(s) and Role:    * Johnathan Hausen, MD - Primary  PHYSICIAN ASSISTANT:   ASSISTANTS: none   ANESTHESIA:   general  EBL:  Total I/O In: 1000 [I.V.:1000] Out: 125 [Urine:75; Blood:50]  BLOOD ADMINISTERED:none  DRAINS: none   LOCAL MEDICATIONS USED:  BUPIVICAINE   SPECIMEN:  Source of Specimen:  appendix  DISPOSITION OF SPECIMEN:  PATHOLOGY  COUNTS:  YES  TOURNIQUET:  * No tourniquets in log *  DICTATION: .Other Dictation: Dictation Number [id number]  PLAN OF CARE: Admit for overnight observation  PATIENT DISPOSITION:  PACU - hemodynamically stable.   Delay start of Pharmacological VTE agent (>24hrs) due to surgical blood loss or risk of bleeding: no

## 2014-04-02 NOTE — Anesthesia Postprocedure Evaluation (Signed)
  Anesthesia Post-op Note  Patient: [name]  Procedure(s) Performed: Procedure(s) (LRB): LAPAROSCOPIC VENTRAL HERNIA REPAIR (N/A) APPENDECTOMY (N/A)  Patient Location: PACU  Anesthesia Type: General  Level of Consciousness: awake and alert   Airway and Oxygen Therapy: Patient Spontanous Breathing  Post-op Pain: mild  Post-op Assessment: Post-op Vital signs reviewed, Patient's Cardiovascular Status Stable, Respiratory Function Stable, Patent Airway and No signs of Nausea or vomiting  Last Vitals:  Filed Vitals:   04/02/14 1330  BP:   Pulse: 73  Temp:   Resp: 24    Post-op Vital Signs: stable   Complications: No apparent anesthesia complications

## 2014-04-02 NOTE — Interval H&P Note (Signed)
History and Physical Interval Note:  04/02/2014 9:47 AM  [name]  has presented today for surgery, with the diagnosis of VENTRAL HERNIA  The various methods of treatment have been discussed with the patient and family. After consideration of risks, benefits and other options for treatment, the patient has consented to  Procedure(s): [place] (N/A) as a surgical intervention .  The patient's history has been reviewed, patient examined, no change in status, stable for surgery.  I have reviewed the patient's chart and labs.  Questions were answered to the patient's satisfaction.     Daviel Allegretto B

## 2014-04-02 NOTE — H&P (Signed)
Chief Complaint: Ventral incisional hernia  History of Present Illness: [name] is an 44 y.o. female Who underwent a hysterectomy last May at which time there was a colonic injury. Dr. Leighton Ruff came in and helped with the repair. Postoperatively she developed an incisional hernia. This was best seen on the CT scan that shows small bowel loops in the hernia.I saw her in the office and discussed laparoscopic/open repair with mesh. Patient understands and accepts risks. I spoke with Dr. Ronita Hipps regarding an ovarian cyst which he feels is benign.  Will try to visualize laparoscopically.    Past Medical History  Diagnosis Date  . Fibroids   . Hypertension   . Anemia   . SVD (spontaneous vaginal delivery)     x 3  . Diabetes mellitus without complication     type 2  . Headache(784.0)     otc med prn  . History of blood transfusion 03/2013    [place] - 2 units transfued  . Hyperlipidemia   . Obesity     Past Surgical History  Procedure Laterality Date  . Salpingectomy Right 2005    adb insicion  . Tubal ligation    . Laparoscopy  05/22/2013    Procedure: LAPAROSCOPY OPERATIVE LOA; Surgeon: Betsy Coder, MD; Location: [facility]; Service: Gynecology;;  . Laparotomy N/A 05/22/2013    Procedure: EXPLORATORY LAPAROTOMY REPAIR OF SIGMOID COLON SEROSA ; Surgeon: Betsy Coder, MD; Location: [facility]; Service: Gynecology; Laterality: N/A;  . Abdominal hysterectomy N/A 05/22/2013    Procedure: HYSTERECTOMY ABDOMINAL LEFT SALPINGECTOMY; Surgeon: Betsy Coder, MD; Location: [facility]; Service: Gynecology; Laterality: N/A;  . Cystoscopy N/A 05/22/2013    Procedure: CYSTOSCOPY; Surgeon: Betsy Coder, MD; Location: [facility]; Service: Gynecology; Laterality: N/A;    Current Outpatient Prescriptions  Medication Sig Dispense Refill  . Cholecalciferol (VITAMIN D PO) Take by mouth.     . Fe Cbn-Fe Gluc-FA-B12-C-DSS (FERRALET 90 PO) Take 90 mg by mouth daily.    . ferrous sulfate 325 (65 FE) MG tablet Take 325 mg by mouth 2 (two) times daily with a meal.     . HYDROcodone-acetaminophen (NORCO/VICODIN) 5-325 MG per tablet Take 1-2 tablets by mouth every 6 (six) hours as needed for moderate pain. 30 tablet 0  . ibuprofen (ADVIL,MOTRIN) 600 MG tablet 1 po pc every 6 hours for 5 days then prn-pain 30 tablet 1  . Liraglutide (VICTOZA [place]) Inject into the skin.    Marland Kitchen lisinopril-hydrochlorothiazide (PRINZIDE,ZESTORETIC) 10-12.5 MG per tablet Take 1 tablet by mouth daily.    . metFORMIN (GLUCOPHAGE) 500 MG tablet Take 500 mg by mouth 2 (two) times daily with a meal.    . metFORMIN (GLUCOPHAGE) 850 MG tablet Take 1 tablet (850 mg total) by mouth 2 (two) times daily with a meal. 14 tablet 0  . terbinafine (LAMISIL) 250 MG tablet Take 1 tablet (250 mg total) by mouth daily. 30 tablet 0  . terbinafine (LAMISIL) 250 MG tablet Take 1 tablet (250 mg total) by mouth daily. 90 tablet 0  . valACYclovir (VALTREX) 500 MG tablet   0   No current facility-administered medications for this visit.   Review of patient's allergies indicates no known allergies. No family history on file. Social History:  reports that she has never smoked. She has never used smokeless tobacco. She reports that she drinks alcohol. She reports that she does not use illicit drugs.   REVIEW OF SYSTEMS : Negative except for diabetes mellitus  Physical  Exam:  Last menstrual period 03/19/2013. There is no weight on file to calculate BMI.  Gen: WDWN African-American female NAD  Neurological: Alert and oriented to person, place, and time. Motor and sensory function is grossly intact  Head: Normocephalic and atraumatic.  Eyes: Conjunctivae are normal. Pupils are equal, round, and reactive to light. No scleral icterus.  Neck: Normal range of motion. Neck  supple. No tracheal deviation or thyromegaly present.  Cardiovascular: SR without murmurs or gallops. No carotid bruits Breast: Not examined Respiratory: Effort normal. No respiratory distress. No chest wall tenderness. Breath sounds normal. No wheezes, rales or rhonchi.  Abdomen: Soft squishy hernia to the left of midline below the umbilicus consistent with a ventral hernia in the upper most portion of a Pfannenstiel incision. GU: Not examined Musculoskeletal: Normal range of motion. Extremities are nontender. No cyanosis, edema or clubbing noted Lymphadenopathy: No cervical, preauricular, postauricular or axillary adenopathy is present Skin: Skin is warm and dry. No rash noted. No diaphoresis. No erythema. No pallor. Pscyh: Normal mood and affect. Behavior is normal. Judgment and thought content normal.   LABORATORY RESULTS:  Lab Results Last 48 Hours    No results found for this or any previous visit (from the past 48 hour(s)).     RADIOLOGY RESULTS:  Imaging Results (Last 48 hours)    No results found.    Problem List: Patient Active Problem List   Diagnosis Date Noted  . Ventral hernia 02/12/2014  . S/P hysterectomy 05/22/2013  . Menorrhagia 03/21/2013  . OBESITY, NOS 03/02/2006  . MIGRAINE, UNSPEC., W/O INTRACTABLE MIGRAINE 03/02/2006    Assessment & Plan: Ventral hernia. Plan laparoscopically assisted hernia repair with mesh. Visualize ovarian cyst.      Matt B. Hassell Done, MD, [facility] [phone] beeper [phone]

## 2014-04-02 NOTE — Progress Notes (Signed)
Patient is alert and oriented, vital signs are stable, incisions are within normal limits, patient pain controlled adequately with morphine and patient is tolerating her clear liquid diet [name], [name] N RN 04-02-14 7:02 PM

## 2014-04-02 NOTE — Progress Notes (Signed)
Pt has "plastic ear rings" states this was placed there to keep holes from closing up.  Declines to remove these

## 2014-04-02 NOTE — Transfer of Care (Signed)
Immediate Anesthesia Transfer of Care Note  Patient: [name]  Procedure(s) Performed: Procedure(s): LAPAROSCOPIC VENTRAL HERNIA REPAIR (N/A) APPENDECTOMY (N/A)  Patient Location: PACU  Anesthesia Type:General  Level of Consciousness: awake, alert , oriented and patient cooperative  Airway & Oxygen Therapy: Patient Spontanous Breathing and Patient connected to face mask oxygen  Post-op Assessment: Report given to RN and Post -op Vital signs reviewed and stable  Post vital signs: Reviewed and stable  Last Vitals:  Filed Vitals:   04/02/14 0812  BP: 129/83  Pulse: 78  Temp: 36.7 C  Resp: 16    Complications: No apparent anesthesia complications

## 2014-04-02 NOTE — Progress Notes (Signed)
CBC and Serum creatinine results noted- Hgb A1c results pending

## 2014-04-02 NOTE — Anesthesia Preprocedure Evaluation (Addendum)
Anesthesia Evaluation  Patient identified by MRN, date of birth, ID band Patient awake    Reviewed: Allergy & Precautions, NPO status , Patient's Chart, lab work & pertinent test results  Airway Mallampati: II  TM Distance: >3 FB Neck ROM: Full    Dental no notable dental hx.    Pulmonary neg pulmonary ROS,  breath sounds clear to auscultation  Pulmonary exam normal       Cardiovascular hypertension, Pt. on medications Rhythm:Regular Rate:Normal     Neuro/Psych negative neurological ROS  negative psych ROS   GI/Hepatic negative GI ROS, Neg liver ROS,   Endo/Other  diabetes, Type 2, Oral Hypoglycemic Agents  Renal/GU negative Renal ROS  negative genitourinary   Musculoskeletal negative musculoskeletal ROS (+)   Abdominal   Peds negative pediatric ROS (+)  Hematology negative hematology ROS (+)   Anesthesia Other Findings   Reproductive/Obstetrics negative OB ROS                            Anesthesia Physical Anesthesia Plan  ASA: II  Anesthesia Plan: General   Post-op Pain Management:    Induction: Intravenous  Airway Management Planned: Oral ETT  Additional Equipment:   Intra-op Plan:   Post-operative Plan: Extubation in OR  Informed Consent: I have reviewed the patients History and Physical, chart, labs and discussed the procedure including the risks, benefits and alternatives for the proposed anesthesia with the patient or authorized representative who has indicated his/her understanding and acceptance.   Dental advisory given  Plan Discussed with: CRNA  Anesthesia Plan Comments:         Anesthesia Quick Evaluation

## 2014-04-02 NOTE — Progress Notes (Signed)
Hgb A1c , CBC, Serum creatinine drawn by lab.

## 2014-04-03 ENCOUNTER — Encounter (HOSPITAL_COMMUNITY): Payer: Self-pay | Admitting: Surgery

## 2014-04-03 DIAGNOSIS — K439 Ventral hernia without obstruction or gangrene: Secondary | ICD-10-CM | POA: Diagnosis not present

## 2014-04-03 LAB — HEMOGLOBIN A1C
Hgb A1c MFr Bld: 6.4 % — ABNORMAL HIGH (ref 4.8–5.6)
MEAN PLASMA GLUCOSE: 137 mg/dL

## 2014-04-03 LAB — GLUCOSE, CAPILLARY
GLUCOSE-CAPILLARY: 106 mg/dL — AB (ref 70–99)
Glucose-Capillary: 148 mg/dL — ABNORMAL HIGH (ref 70–99)

## 2014-04-03 MED ORDER — OXYCODONE-ACETAMINOPHEN 5-325 MG PO TABS: 1.0000 | ORAL_TABLET | ORAL | Status: DC | PRN

## 2014-04-03 NOTE — Progress Notes (Signed)
UR completed 

## 2014-04-03 NOTE — Progress Notes (Signed)
Discharge instructions and prescriptions given to patient.  Questions answered

## 2014-04-03 NOTE — Discharge Summary (Signed)
Physician Discharge Summary  Patient ID: [name] MRN: [id number] DOB/AGE: 46-05-1970 46 y.o.  Admit date: 04/02/2014 Discharge date: 04/03/2014  Admission Diagnoses:  Ventral hernia  Discharge Diagnoses:  same  Active Problems:   S/P repair of ventral hernia   Surgery:  Laparoscopic appendectomy,   Open primary ventral hernia repair  Discharged Condition: improved  Hospital Course:   Had surgery.  Kept overnight and ready for discharge  Consults: none  Significant Diagnostic Studies: none    Discharge Exam: Blood pressure 114/74, pulse 79, temperature 98.8 F (37.1 C), temperature source Oral, resp. rate 18, height 5\' 3"  (1.6 m), weight 109.317 kg (241 lb), last menstrual period 03/19/2013, SpO2 99 %. Incision OK.  Pain controlled  Disposition: 01-Home or Self Care  Discharge Instructions    Diet Carb Modified    Complete by:  As directed      Discharge instructions    Complete by:  As directed   May shower.  Staples should be removed in 1 week at [place] office.     Discharge wound care:    Complete by:  As directed   Staple removal in 1 week     Increase activity slowly    Complete by:  As directed             Medication List    TAKE these medications        aspirin-acetaminophen-caffeine 250-250-65 MG per tablet  Commonly known as:  EXCEDRIN MIGRAINE  Take 2 tablets by mouth every 6 (six) hours as needed for headache.     FERRALET 90 PO  Take 90 mg by mouth every morning.     fexofenadine 180 MG tablet  Commonly known as:  ALLEGRA  Take 180 mg by mouth every morning.     HYDROcodone-acetaminophen 5-325 MG per tablet  Commonly known as:  NORCO/VICODIN  Take 1-2 tablets by mouth every 6 (six) hours as needed for moderate pain.     ibuprofen 200 MG tablet  Commonly known as:  ADVIL,MOTRIN  Take 400 mg by mouth every 4 (four) hours as needed for headache or moderate pain.     ibuprofen 600 MG tablet  Commonly known as:  ADVIL,MOTRIN  1  po   pc   every 6 hours for 5 days then prn-pain     lisinopril-hydrochlorothiazide 10-12.5 MG per tablet  Commonly known as:  PRINZIDE,ZESTORETIC  Take 1 tablet by mouth every morning.     metFORMIN 500 MG tablet  Commonly known as:  GLUCOPHAGE  Take 500 mg by mouth 2 (two) times daily with a meal.     metFORMIN 850 MG tablet  Commonly known as:  GLUCOPHAGE  Take 1 tablet (850 mg total) by mouth 2 (two) times daily with a meal.     oxyCODONE-acetaminophen 5-325 MG per tablet  Commonly known as:  PERCOCET/ROXICET  Take 1 tablet by mouth every 4 (four) hours as needed for moderate pain.     Phentermine-Topiramate 3.75-23 MG Cp24  Take 1 capsule by mouth daily.     terbinafine 250 MG tablet  Commonly known as:  LAMISIL  Take 1 tablet (250 mg total) by mouth daily.     terbinafine 250 MG tablet  Commonly known as:  LAMISIL  Take 1 tablet (250 mg total) by mouth daily.     valACYclovir 500 MG tablet  Commonly known as:  VALTREX  Take 500 mg by mouth 2 (two) times daily as needed (outbreak).  VICTOZA [place]  Inject 1.8 mg into the skin every morning. Takes 5 pm     Vitamin D3 5000 UNITS Tabs  Take 1 tablet by mouth every morning.         SignedPedro Earls 04/03/2014, 6:56 AM

## 2014-04-03 NOTE — Op Note (Signed)
[name], [name]                ACCOUNT NO.:  [id number]  MEDICAL RECORD NO.:  [id number]  LOCATION:  [place]                         FACILITY:  [facility]  PHYSICIAN:  [name]. Lori Done, MD  DATE OF BIRTH:  12/29/1970  DATE OF PROCEDURE: DATE OF DISCHARGE:                              OPERATIVE REPORT   PREOPERATIVE ASSESSMENT:  This is a 44 year old African American lady who had complicated pelvic surgery last year, resulting in probable sigmoid resection by Dr. Marcello Leonard.  The patient developed ventral hernia from this Pfannenstiel incision and presented to the office with this palpable mass, just below the umbilicus.  OPERATIVE FINDINGS: 1. Appendix stuck down in a bandlike fashion to the inguinal region on     the right.  This resulted in a necessary appendectomy since this     was a dense band which would produce a potential for not only a     bowel obstruction, but appendicitis. 2. Pelvis fixed with adhesions from prior sigmoid surgery.  I did     visualize the structure on the left which may have been an ovarian     remnant, but no ovarian cyst was seen.  We looked and did not see     any structures consistent with a cyst that we could aspirate. 3. Midline defect with large subcutaneous sac which was managed with     sac resection and primary closure with #1 Novafil sutures.  SURGEON:  [name]. Lori Done, MD  ASSISTANT:  None.  ANESTHESIA:  General endotracheal with supplemental Exparel injected into the incisions.  DESCRIPTION OF PROCEDURE:  The patient was taken to room 1 and given general anesthesia.  The abdomen was prepped with PCMX and draped sterilely.  Access to the abdomen was achieved through the left upper quadrant with a 5-mm OptiView without difficulty.  Following insufflation, using the Stryker system, I found numerous adhesions up to this hernia sac.  These were omental and I ended up putting 2 more trocars and two 5s on the right and the left, through which I  was able to pull this down and then divided with the Harmonic scalpel.  After freeing this up, it revealed a fairly prominent midline hernia which was just actually below the umbilicus.  In addition, a structure was caught across the pelvis and it took its origin from the cecum and appeared to be the appendix was stuck into the anterior abdominal wall in the inguinal region.  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  I went ahead and removed the rest of that remnant and did perform appendectomy going through the mesentery of the appendix with Harmonic scalpel and then dividing the base with an Endo GIA.  There was no contamination. These were placed in 2 individual bags and brought out through the nail, 12 mm port in the right lower quadrant.  I went ahead at that point and cut down on the sac and dissected it free from  the surrounding tissue and then found it to be about the size of a large tomato.  I then excised the sac and the fascial defect was about 3 fingerbreadths long and so I felt like I could close this primarily and since I had removed the appendix.  Even though the risk of contamination was small, I did not have to put any artificial material in there.  I then closed with interrupted #1 Novafil, putting in several of these, tying them, and getting good approximation.  The area was then irrigated copiously, injected with Exparel diluted to 30 mL.  I then reinsufflated and looked at the closure and it looked really good and was air tight.  Following injection of these, the wounds were then, following irrigation, closed with staples.  The patient tolerated the procedure well and was taken to the recovery room in satisfactory condition.     [name] Lori Done,  MD     MBM/MEDQ  D:  04/02/2014  T:  04/03/2014  Job:  [id number]  cc:   [name], M.D. Fax: [phone]

## 2014-04-24 ENCOUNTER — Telehealth: Payer: Self-pay | Admitting: *Deleted

## 2014-04-24 NOTE — Telephone Encounter (Signed)
Pt states she finished the pills for the fungus, but still has fungus.  I reviewed pt's medication list and she has completed 120 doses of Lamisil, which is considered the therapeutic dosing.  Pt states she has 5 pills left and doesn't have an appt until 05/22/2014, what to do?  I told pt to complete the last 5 pills and to be aware it can take up to 6-9 months to see improvement.  Pt agrees.

## 2014-05-01 ENCOUNTER — Encounter: Payer: BLUE CROSS/BLUE SHIELD | Admitting: Obstetrics and Gynecology

## 2014-05-22 ENCOUNTER — Encounter: Payer: Self-pay | Admitting: Podiatry

## 2014-05-22 ENCOUNTER — Ambulatory Visit (INDEPENDENT_AMBULATORY_CARE_PROVIDER_SITE_OTHER): Payer: BLUE CROSS/BLUE SHIELD | Admitting: Podiatry

## 2014-05-22 VITALS — BP 129/79 | HR 80 | Resp 16

## 2014-05-22 DIAGNOSIS — Z79899 Other long term (current) drug therapy: Secondary | ICD-10-CM | POA: Diagnosis not present

## 2014-05-22 DIAGNOSIS — L603 Nail dystrophy: Secondary | ICD-10-CM

## 2014-05-22 MED ORDER — TERBINAFINE HCL 250 MG PO TABS: 250.0000 mg | ORAL_TABLET | Freq: Every day | ORAL | Status: DC

## 2014-05-24 NOTE — Progress Notes (Signed)
She presents today for follow-up of her Lamisil therapy and her onychomycosis. She has almost completed 120 days of therapy. She states that it seems to be doing wonderfully.  Objective: Near 100% resolution though she does have a very small area that needs to continue to grow out.  Assessment: Resolving onychomycosis bilateral.  Plan: I'm going to encourage that she take Lamisil 250 mg 1 by mouth every other day. I will follow-up with her in 3 months

## 2014-06-30 ENCOUNTER — Other Ambulatory Visit: Payer: Self-pay

## 2014-08-21 ENCOUNTER — Ambulatory Visit (INDEPENDENT_AMBULATORY_CARE_PROVIDER_SITE_OTHER): Payer: BLUE CROSS/BLUE SHIELD | Admitting: Podiatry

## 2014-08-21 ENCOUNTER — Encounter: Payer: Self-pay | Admitting: Podiatry

## 2014-08-21 VITALS — BP 130/81 | HR 81 | Resp 16

## 2014-08-21 DIAGNOSIS — Z79899 Other long term (current) drug therapy: Secondary | ICD-10-CM

## 2014-08-21 DIAGNOSIS — L603 Nail dystrophy: Secondary | ICD-10-CM | POA: Diagnosis not present

## 2014-08-21 MED ORDER — TERBINAFINE HCL 250 MG PO TABS: 250.0000 mg | ORAL_TABLET | Freq: Every day | ORAL | Status: DC

## 2014-08-21 NOTE — Progress Notes (Signed)
She presents today states that on 7 appendectomy all of my nails look great. This one nail on the big toe left foot is just slow to grow out. It is getting better but it is not well yet. She states that she has finished all of her Lamisil therapy at this point. She denies fever chills nausea vomiting muscle aches pain rashes burning or itching.  Objective: 44 year old black female in apparent no distress vital signs stable alert and oriented 3 pulses are palpable bilateral. Nail plate hallux left does demonstrate some residual onychomycosis approximately two thirds of the nail plate has grown out.  Assessment: Onychomycosis resolving.  Plan: We started her on Lamisil today 250 mg tablets 1 by mouth every other day dispensed 30 tablets.. I will follow-up with her in 3 months.  Roselind Messier DPM

## 2014-11-13 ENCOUNTER — Ambulatory Visit: Payer: BLUE CROSS/BLUE SHIELD | Admitting: Podiatry

## 2014-12-09 ENCOUNTER — Ambulatory Visit (INDEPENDENT_AMBULATORY_CARE_PROVIDER_SITE_OTHER): Payer: BLUE CROSS/BLUE SHIELD | Admitting: Podiatry

## 2014-12-09 ENCOUNTER — Encounter: Payer: Self-pay | Admitting: Podiatry

## 2014-12-09 VITALS — BP 156/89 | HR 75 | Resp 16

## 2014-12-09 DIAGNOSIS — Z79899 Other long term (current) drug therapy: Secondary | ICD-10-CM

## 2014-12-09 DIAGNOSIS — L603 Nail dystrophy: Secondary | ICD-10-CM

## 2014-12-09 MED ORDER — TERBINAFINE HCL 250 MG PO TABS: 250.0000 mg | ORAL_TABLET | Freq: Every day | ORAL | Status: DC

## 2014-12-09 NOTE — Progress Notes (Signed)
She has finished her 120 days of Lamisil as well as 2 months additional. She states that there is still some to grow out, nails are really starting to look good.  Objective: Vital signs are stable alert and oriented 3 nail plates are 579FGE resolved however we need to repeat 100% grown out.  Assessment: Well healing onychomycosis secondary Lamisil therapy.  Plan: At this point I started her on another 2 months of Lamisil 1 tablet every other day follow-up with me in 4 months if necessary.

## 2015-02-16 ENCOUNTER — Other Ambulatory Visit: Payer: Self-pay | Admitting: Internal Medicine

## 2015-02-16 DIAGNOSIS — N63 Unspecified lump in unspecified breast: Secondary | ICD-10-CM

## 2015-03-10 ENCOUNTER — Encounter (INDEPENDENT_AMBULATORY_CARE_PROVIDER_SITE_OTHER): Payer: BLUE CROSS/BLUE SHIELD | Admitting: Podiatry

## 2015-03-10 NOTE — Progress Notes (Signed)
This encounter was created in error - please disregard.

## 2015-03-30 ENCOUNTER — Ambulatory Visit
Admission: RE | Admit: 2015-03-30 | Discharge: 2015-03-30 | Disposition: A | Discharge status: Home / Self Care | Payer: BLUE CROSS/BLUE SHIELD | Source: Ambulatory Visit | Attending: Internal Medicine | Admitting: Internal Medicine

## 2015-03-30 ENCOUNTER — Other Ambulatory Visit: Payer: Self-pay | Admitting: Internal Medicine

## 2015-03-30 DIAGNOSIS — N63 Unspecified lump in unspecified breast: Secondary | ICD-10-CM

## 2015-04-08 ENCOUNTER — Encounter: Payer: Self-pay | Admitting: Physical Therapy

## 2015-04-08 ENCOUNTER — Ambulatory Visit: Payer: BLUE CROSS/BLUE SHIELD | Attending: Student | Admitting: Physical Therapy

## 2015-04-08 DIAGNOSIS — M791 Myalgia, unspecified site: Secondary | ICD-10-CM

## 2015-04-08 NOTE — Patient Instructions (Signed)
Piriformis Stretch, Sitting    Sit, one ankle on opposite knee, same-side hand on crossed knee. Push down on knee, keeping spine straight. Lean torso forward, with flat back, until tension is felt in hamstrings and gluteals of crossed-leg side. Hold 30___ seconds.  Repeat _2__ times per session. Do __2_ sessions per day.  Copyright  VHI. All rights reserved.  Chair Sitting    Sit at edge of seat, spine straight, one leg extended. Put a hand on each thigh and bend forward from the hip, keeping spine straight. Allow hand on extended leg to reach toward toes. Support upper body with other arm. Hold _30__ seconds. Repeat __2_ times per session. Do _2_ sessions per day.  Copyright  VHI. All rights reserved.  Sitting    Sit comfortably. Allow body's muscles to relax. Place hands on belly. Inhale slowly and deeply for _3__ seconds, so hands move out. Then take 3___ seconds to exhale. Repeat __5_ times. Do _3__ times a day. Do not put air in chest Copyright  VHI. All rights reserved.  [name] [address] [name], [name] [address] Phone # [phone] Fax [phone]

## 2015-04-08 NOTE — Therapy (Signed)
[facility] [address] [place], [place], [address] Phone: [phone]   Fax:  [phone]  Physical Therapy Evaluation  Patient Details  Name: [name] MRN: [id number] Date of Birth: 1970-08-17 Referring Provider: Dr. Maceo Pro  Encounter Date: 04/08/2015      PT End of Session - 04/08/15 1654    Visit Number 1   Date for PT Re-Evaluation 07/01/15   Authorization Type BCBS   Authorization - Visit Number 1   Authorization - Number of Visits 27   PT Start Time [id number]   PT Stop Time 1654   PT Time Calculation (min) 39 min   Activity Tolerance Patient tolerated treatment well   Behavior During Therapy [facility] for tasks assessed/performed      Past Medical History  Diagnosis Date  . Fibroids   . Hypertension   . Anemia   . SVD (spontaneous vaginal delivery)     x 3  . Diabetes mellitus without complication ([place])     type 2  . Headache(784.0)     otc med prn  . History of blood transfusion 03/2013    [place] - 2 units transfued  . Hyperlipidemia   . Obesity     Past Surgical History  Procedure Laterality Date  . Salpingectomy Right 2005    adb insicion  . Tubal ligation    . Laparoscopy  05/22/2013    Procedure: LAPAROSCOPY OPERATIVE  LOA;  Surgeon: Betsy Coder, MD;  Location: [facility];  Service: Gynecology;;  . Laparotomy N/A 05/22/2013    Procedure: EXPLORATORY LAPAROTOMY REPAIR OF SIGMOID COLON SEROSA ;  Surgeon: Betsy Coder, MD;  Location: [facility];  Service: Gynecology;  Laterality: N/A;  . Abdominal hysterectomy N/A 05/22/2013    Procedure: HYSTERECTOMY ABDOMINAL LEFT SALPINGECTOMY;  Surgeon: Betsy Coder, MD;  Location: [facility];  Service: Gynecology;  Laterality: N/A;  . Cystoscopy N/A 05/22/2013    Procedure: CYSTOSCOPY;  Surgeon: Betsy Coder, MD;  Location: [facility];  Service: Gynecology;  Laterality: N/A;  . Ventral hernia repair N/A 04/02/2014    Procedure: LAPAROSCOPIC VENTRAL HERNIA  REPAIR;  Surgeon: Johnathan Hausen, MD;  Location: [facility];  Service: General;  Laterality: N/A;  . Appendectomy N/A 04/02/2014    Procedure: APPENDECTOMY;  Surgeon: Johnathan Hausen, MD;  Location: [facility];  Service: General;  Laterality: N/A;    There were no vitals filed for this visit.  Visit Diagnosis:  Myalgia - Plan: PT plan of care cert/re-cert      Subjective Assessment - 04/08/15 1621    Subjective Patient is having abdominal pain that is shooting from back to abdomen and legs. After examination patient was having trouble to walk. Pateint reports after using the lidocaine her left leg feel dead.  Pain has decreased with lidocaine in vaginal area.    Currently in Pain? Yes   Pain Score 4    Pain Location Abdomen  back, and in legs   Pain Orientation Anterior;Posterior   Pain Descriptors / Indicators Aching;Dull;Sharp   Pain Type Chronic pain   Pain Radiating Towards numbness in left leg   Pain Onset More than a month ago   Pain Frequency Constant   Aggravating Factors  sitting, laying flat on stomach   Pain Relieving Factors hot water bottle, standing   Multiple Pain Sites No            [facility] PT Assessment - 04/08/15 0001    Assessment  Medical Diagnosis M62.838 other muscle spasm; R10.2 Pelvic perineal pain; G89.29 other chronic pain   Referring Provider Dr. Maceo Pro   Onset Date/Surgical Date 04/03/13   Prior Therapy Saw chiropractor  helped the back and pelvic pain    Precautions   Precautions None   Restrictions   Weight Bearing Restrictions No   Balance Screen   Has the patient fallen in the past 6 months No   Has the patient had a decrease in activity level because of a fear of falling?  No   Is the patient reluctant to leave their home because of a fear of falling?  No   Home Ecologist residence   Prior Function   Level of Independence Independent   Vocation Full time employment   Vocation Requirements housekeeper    Leisure End of last year had a Physiological scientist and felt something in lower abdominal was pulling   Cognition   Overall Cognitive Status Within Functional Limits for tasks assessed   Observation/Other Assessments   Focus on Therapeutic Outcomes (FOTO)  46% limitation   ROM / Strength   AROM / PROM / Strength AROM;PROM;Strength   AROM   Lumbar Extension decreased by 75% with pain   PROM   Left Hip External Rotation  30   Palpation   Spinal mobility L1-L5 rotated left, Decreased P-A mobility of L1-L5 with pain   SI assessment  left ilium posteriorly rotated; sacrum rotated right                 Pelvic Floor Special Questions - 04/08/15 0001    Prior Pregnancies Yes   Number of Pregnancies 3   Number of Vaginal Deliveries 3   Currently Sexually Active No   Urinary Leakage No   Skin Integrity Intact   Pelvic Floor Internal Exam Patient confirms identification and approves PT to assess muscle integrity and strength   Exam Type Vaginal   Palpation tenderness located on left levator ani   Strength good squeeze, good lift, able to hold agaisnt strong resistance                  PT Education - 04/08/15 1653    Education provided Yes   Education Details flexibility exercises, diaphgramatic breathing   Person(s) Educated Patient   Methods Explanation;Demonstration;Verbal cues;Handout   Comprehension Returned demonstration;Verbalized understanding          PT Short Term Goals - 04/08/15 1701    PT SHORT TERM GOAL #1   Title independent with HEP   Time 4   Period Weeks   Status New   PT SHORT TERM GOAL #2   Title pain with sitting decreased >/= 25%   Time 4   Period Weeks   Status New   PT SHORT TERM GOAL #3   Title understand how to perform perineal massage   Time 4   Period Weeks   Status New   PT SHORT TERM GOAL #4   Title pelvis in correct alignment   Time 4   Period Weeks   Status New           PT Long Term Goals - 04/08/15 1702    PT  LONG TERM GOAL #1   Title independent with HEP and understand how to progress herself    Time 12   Period Weeks   Status New   PT LONG TERM GOAL #2   Title pain with sitting decreased >/= 75%  Time 12   Period Weeks   Status New   PT LONG TERM GOAL #3   Title ability to extend her spine to reach upward with pain decreased >/= 75%   Time 12   Period Weeks   Status New   PT LONG TERM GOAL #4   Title abdominal strength 4/5 so she is able to stabilize her core with daily activities to reduce pain   Time 12   Period Weeks   Status New   PT LONG TERM GOAL #5   Title understand correct body mechanics with daily tasks to decrease strain on lumbar   Time 12   Period Weeks   Status New               Plan - 04/08/15 1654    Clinical Impression Statement Patient is a 45 year old female with diagnosis of pelvic perineal pain, chronic pain, and muscle spasm for 2 years after her hysterectomy.  Patient reports constant pain in pelvic floor, back and legs at level 4/10.  Patient has numbness in left leg.  Pain is worse with sititng, walking, and laying flat on stomach.  Patient pain is better with standing and heat.  Left hip ER passively is 30 degrees.  Abdominal strength is 3/5.  Pelvic floor strength is 4/5.  Palpable tenderness  in left levator ani.  Left ilium is rotated posteriorly and sacrum rotated right.  L1-L5 rotated left and decreased anterior and posterior movement.  Lumbar ROM decreased by 75% with pain.  Patient would benefit from physical therapy to reduce pain and imporve core strength.    Pt will benefit from skilled therapeutic intervention in order to improve on the following deficits Pain;Increased fascial restricitons;Decreased mobility;Increased muscle spasms;Hypomobility;Decreased strength;Decreased range of motion;Decreased endurance;Decreased activity tolerance;Difficulty walking;Impaired flexibility   Rehab Potential Excellent   Clinical Impairments Affecting Rehab  Potential None   PT Frequency 1x / week   PT Duration 12 weeks   PT Treatment/Interventions ADLs/Self Care Home Management;Biofeedback;Cryotherapy;Electrical Stimulation;Ultrasound;Moist Heat;Functional mobility training;Therapeutic activities;Therapeutic exercise;Patient/family education;Neuromuscular re-education;Manual techniques;Dry needling;Passive range of motion   PT Next Visit Plan core stabilization, correct pelvis, modalities as needed, lumbar mobilization, hip ER Prom, soft tissue work, Economist   PT Home Exercise Plan core stabilization   Recommended Other Services None   Consulted and Agree with Plan of Care Patient         Problem List Patient Active Problem List   Diagnosis Date Noted  . S/P repair of ventral hernia 04/02/2014  . Ventral hernia 02/12/2014  . S/P hysterectomy 05/22/2013  . Menorrhagia 03/21/2013  . OBESITY, NOS 03/02/2006  . MIGRAINE, UNSPEC., W/O INTRACTABLE MIGRAINE 03/02/2006    Earlie Counts, PT 04/08/2015 5:07 PM   [facility] [address], [place], [place], [address] Phone: [phone]   Fax:  [phone]  Name: JASLYNE WALSWORTH MRN: [id number] Date of Birth: June 22, 1970

## 2015-04-15 ENCOUNTER — Ambulatory Visit: Payer: BLUE CROSS/BLUE SHIELD | Admitting: Physical Therapy

## 2015-04-15 ENCOUNTER — Encounter: Payer: Self-pay | Admitting: Physical Therapy

## 2015-04-15 DIAGNOSIS — M791 Myalgia, unspecified site: Secondary | ICD-10-CM

## 2015-04-15 DIAGNOSIS — I10 Essential (primary) hypertension: Secondary | ICD-10-CM | POA: Diagnosis not present

## 2015-04-15 DIAGNOSIS — Z6841 Body Mass Index (BMI) 40.0 and over, adult: Secondary | ICD-10-CM | POA: Diagnosis not present

## 2015-04-15 NOTE — Therapy (Addendum)
[facility] [address] [place], [place], [address] Phone: [phone]   Fax:  [phone]  Physical Therapy Treatment  Patient Details  Name: [name] MRN: [id number] Date of Birth: 1970/10/03 Referring Provider: Dr. Maceo Pro  Encounter Date: 04/15/2015      PT End of Session - 04/15/15 0936    Visit Number 2   Date for PT Re-Evaluation 07/01/15   Authorization Type BCBS   Authorization - Visit Number 2   Authorization - Number of Visits 35   PT Start Time [phone]  patient came late   PT Stop Time 1014   PT Time Calculation (min) 38 min   Activity Tolerance Patient tolerated treatment well   Behavior During Therapy [facility] for tasks assessed/performed      Past Medical History  Diagnosis Date  . Fibroids   . Hypertension   . Anemia   . SVD (spontaneous vaginal delivery)     x 3  . Diabetes mellitus without complication ([place])     type 2  . Headache(784.0)     otc med prn  . History of blood transfusion 03/2013    [place] - 2 units transfued  . Hyperlipidemia   . Obesity     Past Surgical History  Procedure Laterality Date  . Salpingectomy Right 2005    adb insicion  . Tubal ligation    . Laparoscopy  05/22/2013    Procedure: LAPAROSCOPY OPERATIVE  LOA;  Surgeon: Betsy Coder, MD;  Location: [facility];  Service: Gynecology;;  . Laparotomy N/A 05/22/2013    Procedure: EXPLORATORY LAPAROTOMY REPAIR OF SIGMOID COLON SEROSA ;  Surgeon: Betsy Coder, MD;  Location: [facility];  Service: Gynecology;  Laterality: N/A;  . Abdominal hysterectomy N/A 05/22/2013    Procedure: HYSTERECTOMY ABDOMINAL LEFT SALPINGECTOMY;  Surgeon: Betsy Coder, MD;  Location: [facility];  Service: Gynecology;  Laterality: N/A;  . Cystoscopy N/A 05/22/2013    Procedure: CYSTOSCOPY;  Surgeon: Betsy Coder, MD;  Location: [facility];  Service: Gynecology;  Laterality: N/A;  . Ventral hernia repair N/A 04/02/2014    Procedure:  LAPAROSCOPIC VENTRAL HERNIA REPAIR;  Surgeon: Johnathan Hausen, MD;  Location: [facility];  Service: General;  Laterality: N/A;  . Appendectomy N/A 04/02/2014    Procedure: APPENDECTOMY;  Surgeon: Johnathan Hausen, MD;  Location[facility];  Service: General;  Laterality: N/A;    There were no vitals filed for this visit.      Subjective Assessment - 04/15/15 0937    Subjective I feel pretty good.  I have not had as much pain.    Patient Stated Goals reduce pain   Currently in Pain? No/denies            [facility] PT Assessment - 04/15/15 0001    PROM   Left Hip External Rotation  70   Palpation   SI assessment  Pelvis in correct alignment                  Pelvic Floor Special Questions - 04/15/15 0001    Pelvic Floor Internal Exam Patient confirms identification and approves PT to assess muscle integrity and strength   Exam Type Vaginal           [facility] Adult PT Treatment/Exercise - 04/15/15 0001    Manual Therapy   Manual Therapy Internal Pelvic Floor   Internal Pelvic Floor soft tissue work to left levator ani and muscle attachment to coccyx  PT Education - 04/15/15 1009    Education provided Yes   Education Details core strengthing, flexibility exercises   Person(s) Educated Patient   Methods Explanation;Demonstration;Verbal cues;Handout   Comprehension Verbalized understanding;Returned demonstration          PT Short Term Goals - 04/15/15 0949    PT SHORT TERM GOAL #1   Title independent with HEP   Time 4   Period Weeks   Status Achieved   PT SHORT TERM GOAL #2   Title pain with sitting decreased >/= 25%   Time 4   Period Weeks   Status Achieved  80%   PT SHORT TERM GOAL #3   Title understand how to perform perineal massage   Time 4   Period Weeks   Status Deferred  no vaginal pain so not needed   PT SHORT TERM GOAL #4   Title pelvis in correct alignment   Time 4   Period Weeks   Status Achieved           PT Long  Term Goals - 04/08/15 1702    PT LONG TERM GOAL #1   Title independent with HEP and understand how to progress herself    Time 12   Period Weeks   Status New   PT LONG TERM GOAL #2   Title pain with sitting decreased >/= 75%   Time 12   Period Weeks   Status New   PT LONG TERM GOAL #3   Title ability to extend her spine to reach upward with pain decreased >/= 75%   Time 12   Period Weeks   Status New   PT LONG TERM GOAL #4   Title abdominal strength 4/5 so she is able to stabilize her core with daily activities to reduce pain   Time 12   Period Weeks   Status New   PT LONG TERM GOAL #5   Title understand correct body mechanics with daily tasks to decrease strain on lumbar   Time 12   Period Weeks   Status New               Plan - 04/15/15 1010    Clinical Impression Statement Patient is a 45 year old female with diagnosis of pelvic perineal pain, chronic pain, muscle spasm for 2 years after her hysterectomy.  Patient reports her pain with sitting is 80% decreased.  Patient pelvis in correct alignment.  Left hip ER increased 70 degrees form 30 degress.  No palpable tenderness located in left levator ani.  Patient has met her STG's.  Patient will benefit from physical therapy to reduce pain .    Rehab Potential Excellent   Clinical Impairments Affecting Rehab Potential None   PT Frequency 1x / week   PT Duration 12 weeks   PT Treatment/Interventions ADLs/Self Care Home Management;Biofeedback;Cryotherapy;Electrical Stimulation;Ultrasound;Moist Heat;Functional mobility training;Therapeutic activities;Therapeutic exercise;Patient/family education;Neuromuscular re-education;Manual techniques;Dry needling;Passive range of motion   PT Next Visit Plan core stabilization,modalities as needed, lumbar mobilization, soft tissue work, Economist   PT Home Exercise Plan progress as needed   Consulted and Agree with Plan of Care Patient      Patient will benefit from skilled  therapeutic intervention in order to improve the following deficits and impairments:  Pain, Increased fascial restricitons, Decreased mobility, Increased muscle spasms, Hypomobility, Decreased strength, Decreased range of motion, Decreased endurance, Decreased activity tolerance, Difficulty walking, Impaired flexibility  Visit Diagnosis: Myalgia     Problem List Patient Active Problem List  Diagnosis Date Noted  . S/P repair of ventral hernia 04/02/2014  . Ventral hernia 02/12/2014  . S/P hysterectomy 05/22/2013  . Menorrhagia 03/21/2013  . OBESITY, NOS 03/02/2006  . MIGRAINE, UNSPEC., W/O INTRACTABLE MIGRAINE 03/02/2006    Earlie Counts, PT 04/15/2015 10:13 AM    [facility] [address], [place], [place], [address] Phone: [phone]   Fax:  [phone]  Name: [name] MRN: [id number] Date of Birth: [birth year]    PHYSICAL THERAPY DISCHARGE SUMMARY  Visits from Start of Care: 2  Current functional level related to goals / functional outcomes: See above. Patient called to cancel all appointments due to feeling better.    Remaining deficits: See above.    Education / Equipment: HEP Plan: Patient agrees to discharge.  Patient goals were partially met. Patient is being discharged due to the patient's request.  Thank  You for the referral. Earlie Counts, PT 04/28/2015 11:20 AM  ?????

## 2015-04-15 NOTE — Patient Instructions (Signed)
Isometric Hold With Pelvic Floor (Hook-Lying)    Lie with hips and knees bent. Slowly inhale, and then exhale. Pull navel toward spine and tighten pelvic floor. Hold for __5_ seconds. Continue to breathe in and out during hold. Rest for __1_ seconds. Repeat _10__ times. Do _1__ times a day.   Copyright  VHI. All rights reserved.    Bracing With Knee Fallout (Hook-Lying)    With neutral spine, tighten pelvic floor and abdominals and hold. Alternating legs, drop knee out to side. Keep opposite hip still. Repeat _10__ times. Do __1_ times a day. 10x each side.  Copyright  VHI. All rights reserved.    Bracing With Arms / Legs (Hook-Lying)    With neutral spine, tighten pelvic floor and abdominals and hold. Raise arm and opposite leg, then return. Repeat wtih other limbs. Repeat _10__ times. Do _1__ times a day.   Copyright  VHI. All rights reserved.  Bracing With Bridging (Hook-Lying)     tighten pelvic floor and abdominals and hold. Lift bottom while lifting one vertebrae at a time Hold 5 sec, slowly come down letting one vertebrae come down. Repeat _10__ times. Do _1__ times a day.   Copyright  VHI. All rights reserved.  Bracing With Arm / Leg Raise (Quadruped)    On hands and knees find neutral spine. Tighten pelvic floor and abdominals and hold. Alternating, lift arm to shoulder level and opposite leg to hip level. Repeat _10__ times. Do _1__ times a day.   Copyright  VHI. All rights reserved.    Cat Back    Kneel on hands and knees. Tuck chin and tighten stomach. Exhale and round back upward. Inhale and arch back downward. Hold each position _3__ seconds. Repeat __10_ times per session. Do _1__ sessions per day.  Copyright  VHI. All rights reserved.  Mid-Back Stretch    Push chest toward floor, reaching forward as far as possible. Hold __15__ seconds. Repeat __2__ times per set. Do _1___ sets per session. Do _2___ sessions per  day.  http://orth.exer.us/130   Copyright  VHI. All rights reserved.   [facility] [address] [place], [place] [address] Phone # [phone] Fax [phone]

## 2015-04-22 ENCOUNTER — Encounter: Payer: BLUE CROSS/BLUE SHIELD | Admitting: Physical Therapy

## 2015-04-29 ENCOUNTER — Encounter: Payer: BLUE CROSS/BLUE SHIELD | Admitting: Physical Therapy

## 2015-05-06 ENCOUNTER — Encounter: Payer: BLUE CROSS/BLUE SHIELD | Admitting: Physical Therapy

## 2015-05-13 ENCOUNTER — Encounter: Payer: BLUE CROSS/BLUE SHIELD | Admitting: Physical Therapy

## 2015-05-20 ENCOUNTER — Encounter: Payer: BLUE CROSS/BLUE SHIELD | Admitting: Physical Therapy

## 2015-10-12 IMAGING — MG MM SCREENING BREAST TOMO BILATERAL
8 of 12 series · 8 of 28 positions shown · non-contrast
Comparison: Previous exam(s)

None

ACR Breast Density Category a: The breast tissue is almost entirely
fatty.

CLINICAL DATA: Screening.

EXAM:
DIGITAL SCREENING BILATERAL MAMMOGRAM WITH 3D TOMO WITH CAD

[L MLO]
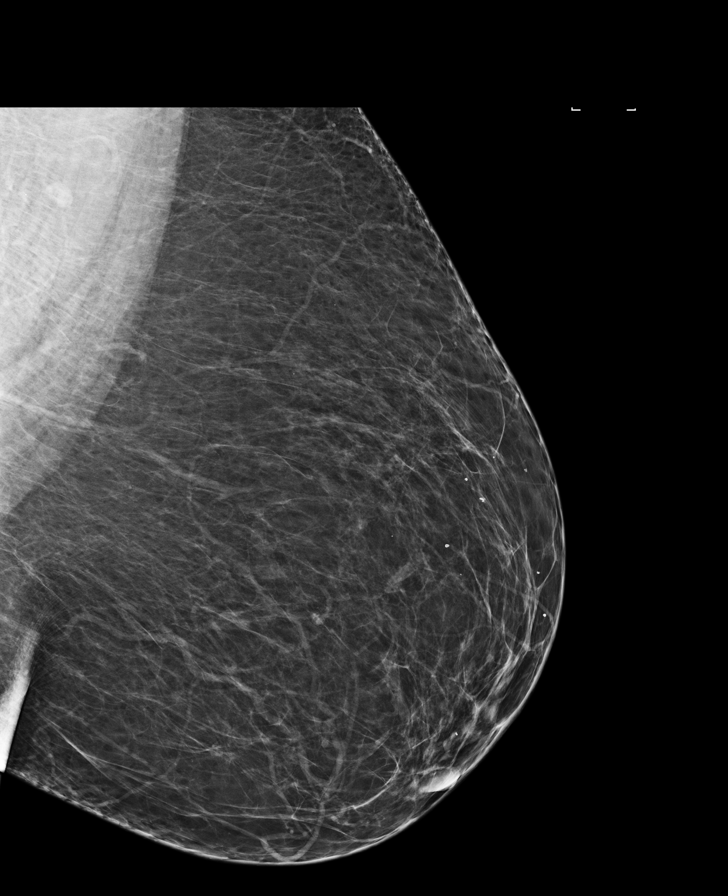

[R CC]
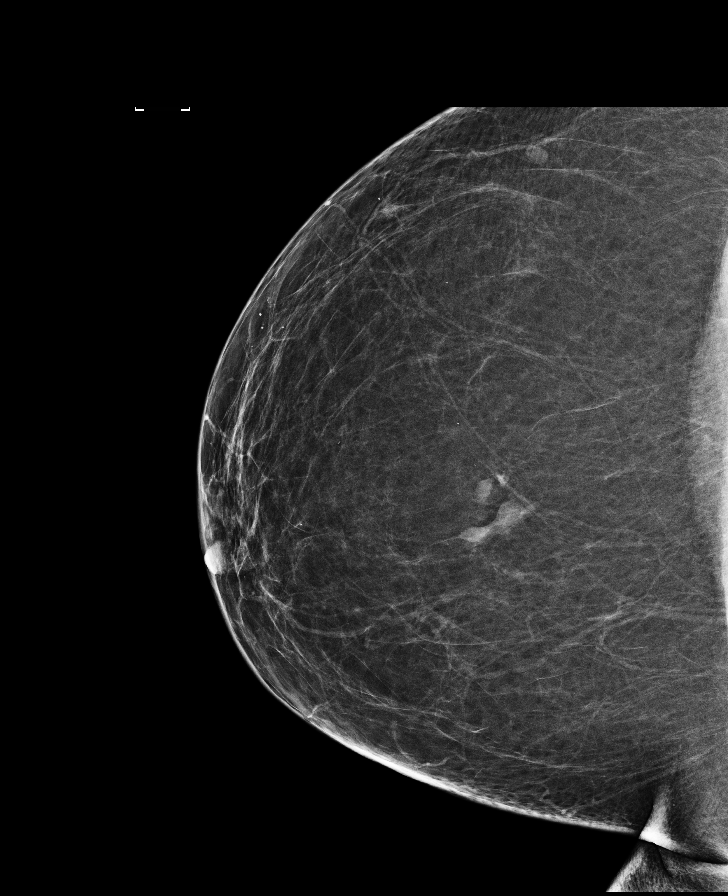

[R MLO]
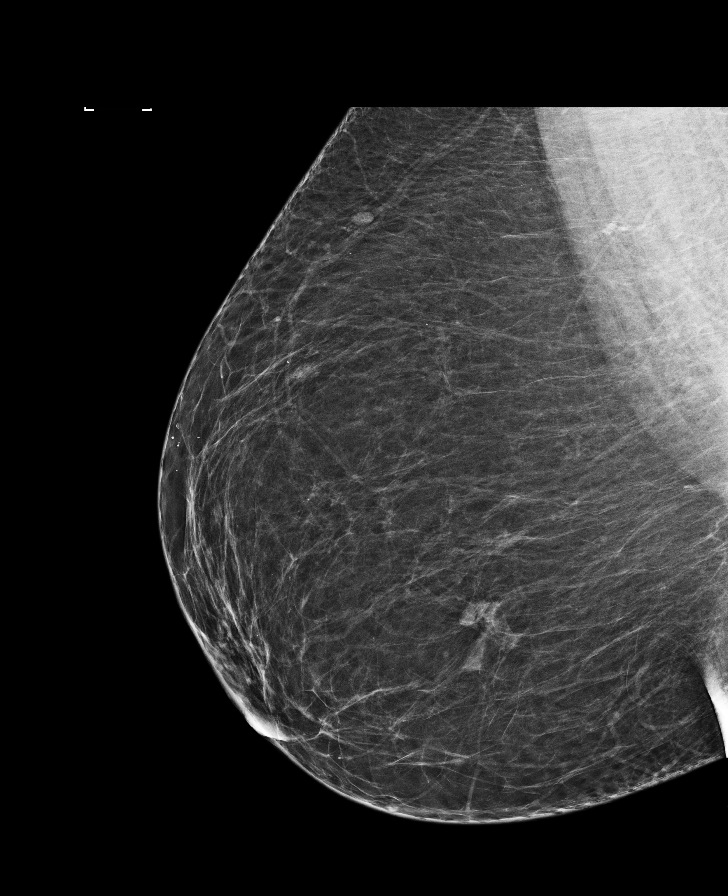

[L CC]
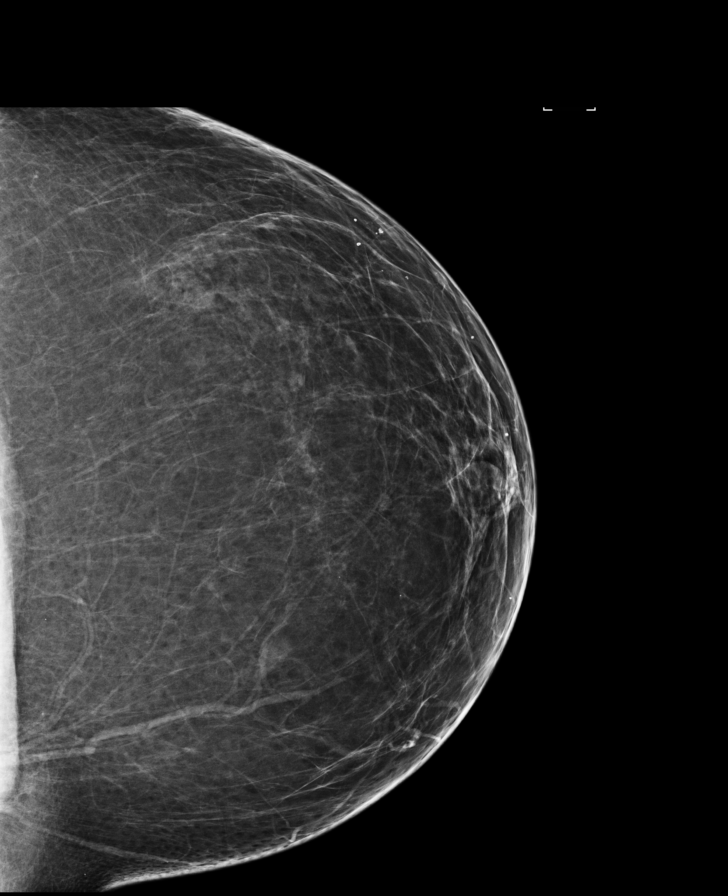

[L CC synth-2D]
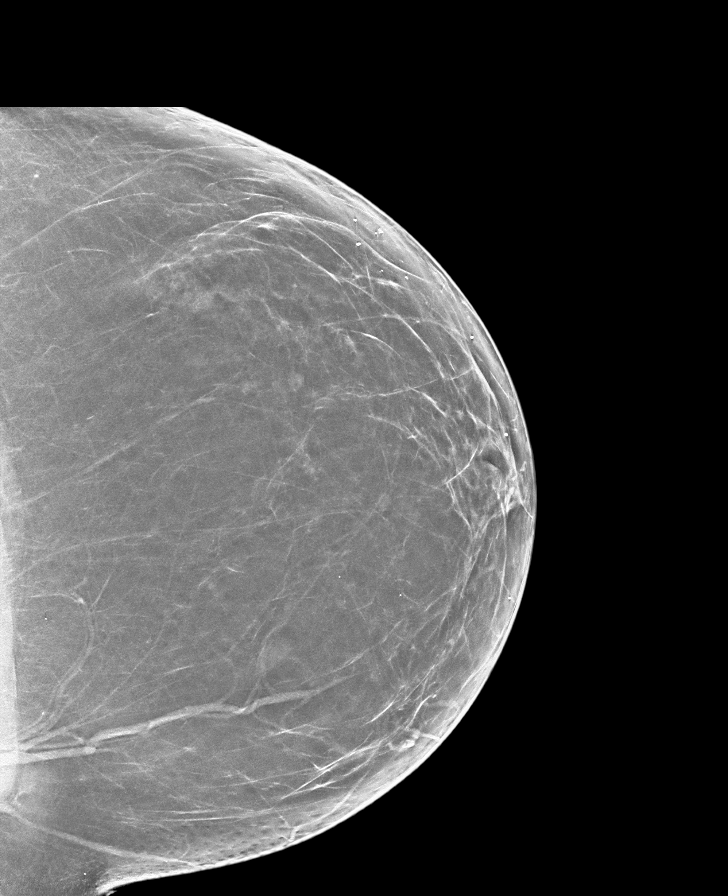

[R CC synth-2D]
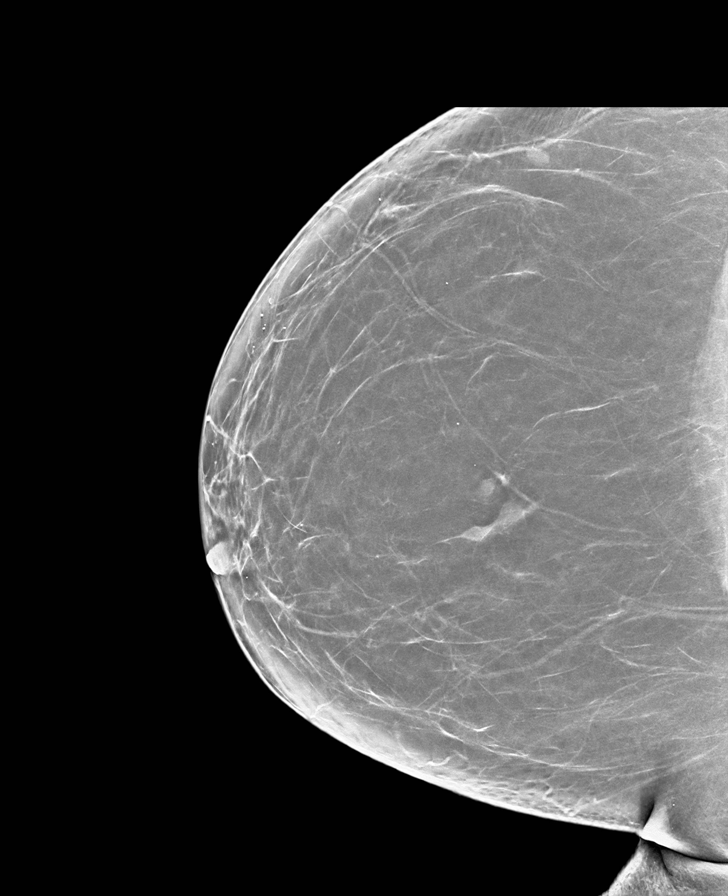

[R MLO synth-2D]
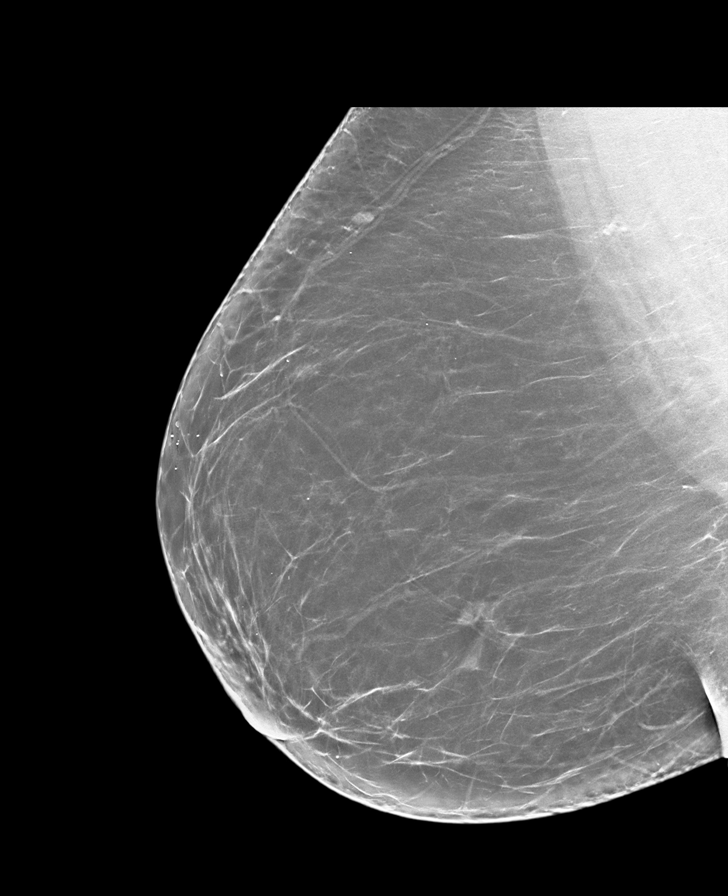

[L MLO synth-2D]
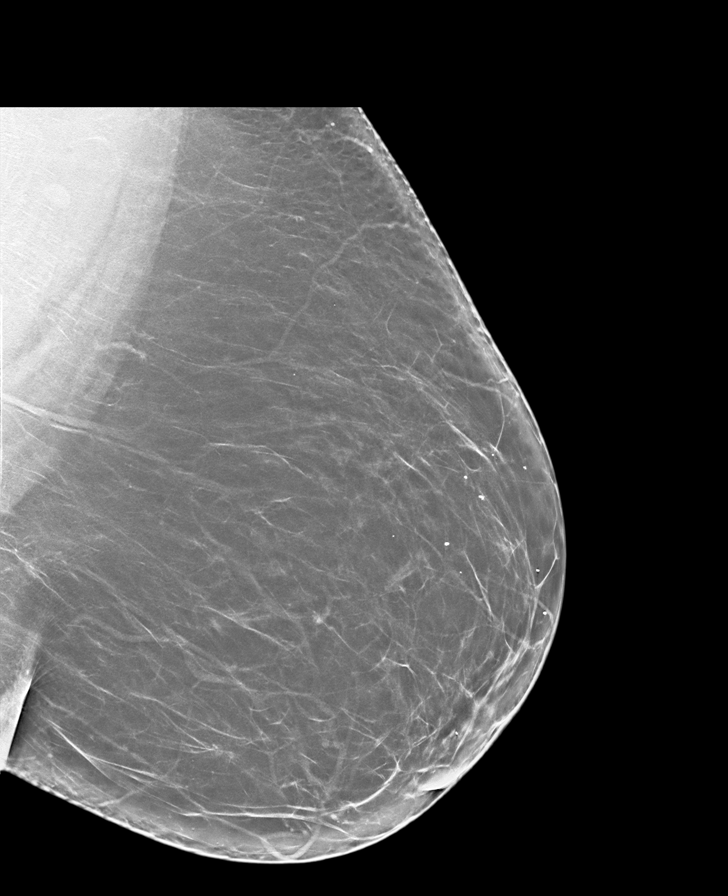

[8 of 28 positions shown; findings below may reference images not displayed]

FINDINGS: In the right breast, a possible mass warrants further evaluation
with ultrasound. In the left breast, no findings suspicious for
malignancy. Images were processed with CAD.
IMPRESSION: Further evaluation is suggested for possible mass in the right
breast.

RECOMMENDATION:
Ultrasound of the right breast. (Code:[id number])

The patient will be contacted regarding the findings, and additional
imaging will be scheduled.

BI-RADS CATEGORY  0: Incomplete. Need additional imaging evaluation
and/or prior mammograms for comparison.

## 2016-01-25 DIAGNOSIS — Z6841 Body Mass Index (BMI) 40.0 and over, adult: Secondary | ICD-10-CM | POA: Diagnosis not present

## 2016-01-25 DIAGNOSIS — E1165 Type 2 diabetes mellitus with hyperglycemia: Secondary | ICD-10-CM | POA: Diagnosis not present

## 2016-01-25 DIAGNOSIS — R0982 Postnasal drip: Secondary | ICD-10-CM | POA: Diagnosis not present

## 2016-01-25 DIAGNOSIS — I10 Essential (primary) hypertension: Secondary | ICD-10-CM | POA: Diagnosis not present

## 2016-02-08 DIAGNOSIS — H00036 Abscess of eyelid left eye, unspecified eyelid: Secondary | ICD-10-CM | POA: Diagnosis not present

## 2016-04-25 ENCOUNTER — Other Ambulatory Visit: Payer: Self-pay | Admitting: Internal Medicine

## 2016-04-25 DIAGNOSIS — Z79899 Other long term (current) drug therapy: Secondary | ICD-10-CM | POA: Diagnosis not present

## 2016-04-25 DIAGNOSIS — Z1231 Encounter for screening mammogram for malignant neoplasm of breast: Secondary | ICD-10-CM

## 2016-04-25 DIAGNOSIS — I10 Essential (primary) hypertension: Secondary | ICD-10-CM | POA: Diagnosis not present

## 2016-04-25 DIAGNOSIS — E1165 Type 2 diabetes mellitus with hyperglycemia: Secondary | ICD-10-CM | POA: Diagnosis not present

## 2016-04-25 DIAGNOSIS — Z6841 Body Mass Index (BMI) 40.0 and over, adult: Secondary | ICD-10-CM | POA: Diagnosis not present

## 2016-04-26 ENCOUNTER — Ambulatory Visit
Admission: RE | Admit: 2016-04-26 | Discharge: 2016-04-26 | Disposition: A | Discharge status: Home / Self Care | Payer: BLUE CROSS/BLUE SHIELD | Source: Ambulatory Visit | Attending: Internal Medicine | Admitting: Internal Medicine

## 2016-04-26 DIAGNOSIS — Z1231 Encounter for screening mammogram for malignant neoplasm of breast: Secondary | ICD-10-CM | POA: Diagnosis not present

## 2016-07-08 DIAGNOSIS — H6123 Impacted cerumen, bilateral: Secondary | ICD-10-CM | POA: Diagnosis not present

## 2016-07-08 DIAGNOSIS — I10 Essential (primary) hypertension: Secondary | ICD-10-CM | POA: Diagnosis not present

## 2016-07-08 DIAGNOSIS — E785 Hyperlipidemia, unspecified: Secondary | ICD-10-CM | POA: Diagnosis not present

## 2016-07-08 DIAGNOSIS — Z0000 Encounter for general adult medical examination without abnormal findings: Secondary | ICD-10-CM | POA: Diagnosis not present

## 2016-07-08 DIAGNOSIS — E1165 Type 2 diabetes mellitus with hyperglycemia: Secondary | ICD-10-CM | POA: Diagnosis not present

## 2016-07-22 DIAGNOSIS — H00021 Hordeolum internum right upper eyelid: Secondary | ICD-10-CM | POA: Diagnosis not present

## 2016-10-17 DIAGNOSIS — E1165 Type 2 diabetes mellitus with hyperglycemia: Secondary | ICD-10-CM | POA: Diagnosis not present

## 2016-10-17 DIAGNOSIS — Z79899 Other long term (current) drug therapy: Secondary | ICD-10-CM | POA: Diagnosis not present

## 2016-10-17 DIAGNOSIS — R0982 Postnasal drip: Secondary | ICD-10-CM | POA: Diagnosis not present

## 2016-10-17 DIAGNOSIS — I10 Essential (primary) hypertension: Secondary | ICD-10-CM | POA: Diagnosis not present

## 2016-11-18 DIAGNOSIS — J320 Chronic maxillary sinusitis: Secondary | ICD-10-CM | POA: Diagnosis not present

## 2016-11-18 DIAGNOSIS — H6062 Unspecified chronic otitis externa, left ear: Secondary | ICD-10-CM | POA: Diagnosis not present

## 2016-11-18 DIAGNOSIS — H6122 Impacted cerumen, left ear: Secondary | ICD-10-CM | POA: Diagnosis not present

## 2016-11-18 DIAGNOSIS — J322 Chronic ethmoidal sinusitis: Secondary | ICD-10-CM | POA: Diagnosis not present

## 2016-12-06 DIAGNOSIS — H02421 Myogenic ptosis of right eyelid: Secondary | ICD-10-CM | POA: Diagnosis not present

## 2016-12-06 DIAGNOSIS — D3131 Benign neoplasm of right choroid: Secondary | ICD-10-CM | POA: Diagnosis not present

## 2016-12-06 DIAGNOSIS — E119 Type 2 diabetes mellitus without complications: Secondary | ICD-10-CM | POA: Diagnosis not present

## 2017-03-01 DIAGNOSIS — F411 Generalized anxiety disorder: Secondary | ICD-10-CM | POA: Diagnosis not present

## 2017-06-01 DIAGNOSIS — I10 Essential (primary) hypertension: Secondary | ICD-10-CM | POA: Diagnosis not present

## 2017-06-01 DIAGNOSIS — Z6841 Body Mass Index (BMI) 40.0 and over, adult: Secondary | ICD-10-CM | POA: Diagnosis not present

## 2017-06-01 DIAGNOSIS — E1165 Type 2 diabetes mellitus with hyperglycemia: Secondary | ICD-10-CM | POA: Diagnosis not present

## 2017-06-01 DIAGNOSIS — E785 Hyperlipidemia, unspecified: Secondary | ICD-10-CM | POA: Diagnosis not present

## 2017-08-24 ENCOUNTER — Other Ambulatory Visit: Payer: Self-pay | Admitting: Internal Medicine

## 2017-08-24 DIAGNOSIS — E1165 Type 2 diabetes mellitus with hyperglycemia: Secondary | ICD-10-CM | POA: Diagnosis not present

## 2017-08-24 DIAGNOSIS — R197 Diarrhea, unspecified: Secondary | ICD-10-CM | POA: Diagnosis not present

## 2017-08-24 DIAGNOSIS — R1032 Left lower quadrant pain: Secondary | ICD-10-CM | POA: Diagnosis not present

## 2017-08-24 DIAGNOSIS — I10 Essential (primary) hypertension: Secondary | ICD-10-CM | POA: Diagnosis not present

## 2017-08-24 DIAGNOSIS — Z0000 Encounter for general adult medical examination without abnormal findings: Secondary | ICD-10-CM | POA: Diagnosis not present

## 2017-08-24 DIAGNOSIS — Z1231 Encounter for screening mammogram for malignant neoplasm of breast: Secondary | ICD-10-CM

## 2017-09-11 DIAGNOSIS — R1033 Periumbilical pain: Secondary | ICD-10-CM | POA: Diagnosis not present

## 2017-09-11 DIAGNOSIS — R197 Diarrhea, unspecified: Secondary | ICD-10-CM | POA: Diagnosis not present

## 2017-09-11 DIAGNOSIS — E119 Type 2 diabetes mellitus without complications: Secondary | ICD-10-CM | POA: Diagnosis not present

## 2017-09-18 ENCOUNTER — Ambulatory Visit: Payer: BLUE CROSS/BLUE SHIELD

## 2017-09-20 DIAGNOSIS — F411 Generalized anxiety disorder: Secondary | ICD-10-CM | POA: Diagnosis not present

## 2017-10-02 ENCOUNTER — Ambulatory Visit
Admission: RE | Admit: 2017-10-02 | Discharge: 2017-10-02 | Disposition: A | Discharge status: Home / Self Care | Payer: BLUE CROSS/BLUE SHIELD | Source: Ambulatory Visit | Attending: Internal Medicine | Admitting: Internal Medicine

## 2017-10-02 DIAGNOSIS — Z1231 Encounter for screening mammogram for malignant neoplasm of breast: Secondary | ICD-10-CM

## 2017-10-10 DIAGNOSIS — M5416 Radiculopathy, lumbar region: Secondary | ICD-10-CM | POA: Diagnosis not present

## 2017-10-10 DIAGNOSIS — M25561 Pain in right knee: Secondary | ICD-10-CM | POA: Diagnosis not present

## 2017-10-10 DIAGNOSIS — M170 Bilateral primary osteoarthritis of knee: Secondary | ICD-10-CM | POA: Diagnosis not present

## 2017-10-11 DIAGNOSIS — M5416 Radiculopathy, lumbar region: Secondary | ICD-10-CM | POA: Diagnosis not present

## 2017-10-26 ENCOUNTER — Ambulatory Visit: Payer: BLUE CROSS/BLUE SHIELD

## 2017-11-02 DIAGNOSIS — M5136 Other intervertebral disc degeneration, lumbar region: Secondary | ICD-10-CM | POA: Diagnosis not present

## 2017-11-15 DIAGNOSIS — M1712 Unilateral primary osteoarthritis, left knee: Secondary | ICD-10-CM | POA: Diagnosis not present

## 2017-11-15 DIAGNOSIS — M1711 Unilateral primary osteoarthritis, right knee: Secondary | ICD-10-CM | POA: Diagnosis not present

## 2017-11-22 DIAGNOSIS — M1712 Unilateral primary osteoarthritis, left knee: Secondary | ICD-10-CM | POA: Diagnosis not present

## 2017-11-22 DIAGNOSIS — M1711 Unilateral primary osteoarthritis, right knee: Secondary | ICD-10-CM | POA: Diagnosis not present

## 2017-11-23 ENCOUNTER — Ambulatory Visit: Payer: BLUE CROSS/BLUE SHIELD | Admitting: Internal Medicine

## 2017-11-23 ENCOUNTER — Encounter: Payer: Self-pay | Admitting: Internal Medicine

## 2017-11-23 VITALS — BP 126/98 | HR 86 | Temp 97.9°F | Ht 63.0 in | Wt 241.8 lb

## 2017-11-23 DIAGNOSIS — I10 Essential (primary) hypertension: Secondary | ICD-10-CM | POA: Insufficient documentation

## 2017-11-23 DIAGNOSIS — Z6841 Body Mass Index (BMI) 40.0 and over, adult: Secondary | ICD-10-CM

## 2017-11-23 DIAGNOSIS — E1165 Type 2 diabetes mellitus with hyperglycemia: Secondary | ICD-10-CM | POA: Diagnosis not present

## 2017-11-23 LAB — BMP8+EGFR
BUN / CREAT RATIO: 22 (ref 9–23)
BUN: 19 mg/dL (ref 6–24)
CALCIUM: 10 mg/dL (ref 8.7–10.2)
CHLORIDE: 99 mmol/L (ref 96–106)
CO2: 25 mmol/L (ref 20–29)
Creatinine, Ser: 0.87 mg/dL (ref 0.57–1.00)
GFR calc Af Amer: 90 mL/min/{1.73_m2} (ref >59)
GFR calc non Af Amer: 78 mL/min/{1.73_m2} (ref >59)
Glucose: 126 mg/dL — ABNORMAL HIGH (ref 65–99)
POTASSIUM: 4.6 mmol/L (ref 3.5–5.2)
SODIUM: 139 mmol/L (ref 134–144)

## 2017-11-23 LAB — LIPID PANEL
Chol/HDL Ratio: 4 ratio (ref 0.0–4.4)
Cholesterol, Total: 191 mg/dL (ref 100–199)
HDL: 48 mg/dL (ref >39)
LDL Calculated: 130 mg/dL — ABNORMAL HIGH (ref 0–99)
Triglycerides: 63 mg/dL (ref 0–149)
VLDL Cholesterol Cal: 13 mg/dL (ref 5–40)

## 2017-11-23 LAB — HEMOGLOBIN A1C
Est. average glucose Bld gHb Est-mCnc: 154 mg/dL
HEMOGLOBIN A1C: 7 % — AB (ref 4.8–5.6)

## 2017-11-23 MED ORDER — DULAGLUTIDE 1.5 MG/0.5ML ~~LOC~~ SOAJ: SUBCUTANEOUS | 1 refills | Status: DC

## 2017-11-23 NOTE — Patient Instructions (Signed)
Obesity, Adult Obesity is having too much body fat. If you have a BMI of 30 or more, you are obese. BMI is a number that explains how much body fat you have. Obesity is often caused by taking in (consuming) more calories than your body uses. Obesity can cause serious health problems. Changing your lifestyle can help to treat obesity. Follow these instructions at home: Eating and drinking   Follow advice from your doctor about what to eat and drink. Your doctor may tell you to: ? Cut down on (limit) fast foods, sweets, and processed snack foods. ? Choose low-fat options. For example, choose low-fat milk instead of whole milk. ? Eat 5 or more servings of fruits or vegetables every day. ? Eat at home more often. This gives you more control over what you eat. ? Choose healthy foods when you eat out. ? Learn what a healthy portion size is. A portion size is the amount of a certain food that is healthy for you to eat at one time. This is different for each person. ? Keep low-fat snacks available. ? Avoid sugary drinks. These include soda, fruit juice, iced tea that is sweetened with sugar, and flavored milk. ? Eat a healthy breakfast.  Drink enough water to keep your pee (urine) clear or pale yellow.  Do not go without eating for long periods of time (do not fast).  Do not go on popular or trendy diets (fad diets). Physical Activity  Exercise often, as told by your doctor. Ask your doctor: ? What types of exercise are safe for you. ? How often you should exercise.  Warm up and stretch before being active.  Do slow stretching after being active (cool down).  Rest between times of being active. Lifestyle  Limit how much time you spend in front of your TV, computer, or video game system (be less sedentary).  Find ways to reward yourself that do not involve food.  Limit alcohol intake to no more than 1 drink a day for nonpregnant women and 2 drinks a day for men. One drink equals 12 oz  of beer, 5 oz of wine, or 1 oz of hard liquor. General instructions  Keep a weight loss journal. This can help you keep track of: ? The food that you eat. ? The exercise that you do.  Take over-the-counter and prescription medicines only as told by your doctor.  Take vitamins and supplements only as told by your doctor.  Think about joining a support group. Your doctor may be able to help with this.  Keep all follow-up visits as told by your doctor. This is important. Contact a doctor if:  You cannot meet your weight loss goal after you have changed your diet and lifestyle for 6 weeks. This information is not intended to replace advice given to you by your health care provider. Make sure you discuss any questions you have with your health care provider. Document Released: 03/14/2011 Document Revised: 05/28/2015 Document Reviewed: 10/08/2014 Elsevier Interactive Patient Education  2018 Elsevier Inc.  

## 2017-11-23 NOTE — Progress Notes (Signed)
Subjective:     Patient ID: [name] , female    DOB: October 22, 1970 , 47 y.o.   MRN: [id number]   Chief Complaint  Patient presents with  . Diabetes  . Hypertension    HPI  Diabetes  She presents for her follow-up diabetic visit. She has type 2 diabetes mellitus. Her disease course has been improving. There are no hypoglycemic associated symptoms. Pertinent negatives for diabetes include no chest pain, no fatigue and no visual change. There are no hypoglycemic complications. Symptoms are stable. There are no diabetic complications. Risk factors for coronary artery disease include diabetes mellitus, hypertension, obesity and sedentary lifestyle.  Hypertension  This is a chronic problem. The current episode started more than 1 year ago. The problem has been gradually improving since onset. The problem is controlled. Pertinent negatives include no chest pain.   REPORTS COMPLIANCE WITH MEDS.   Past Medical History:  Diagnosis Date  . Anemia   . Diabetes mellitus without complication ([place])    type 2  . Fibroids   . Headache(784.0)    otc med prn  . History of blood transfusion 03/2013   [place] - 2 units transfued  . Hyperlipidemia   . Hypertension   . Obesity   . SVD (spontaneous vaginal delivery)    x 3     Family History  Problem Relation Age of Onset  . Diabetes Mother   . Hypertension Mother      Current Outpatient Medications:  .  ACCU-CHEK GUIDE test strip, CHECK BS BID, Disp: , Rfl: 10 .  aspirin-acetaminophen-caffeine (EXCEDRIN MIGRAINE) 213-086-57 MG per tablet, Take 2 tablets by mouth every 6 (six) hours as needed for headache., Disp: , Rfl:  .  B-D UF III MINI PEN NEEDLES 31G X 5 MM MISC, USE AS DIRECTED WITH VICTOZA PEN, Disp: , Rfl: 0 .  Cholecalciferol (VITAMIN D3) 50000 UNITS CAPS, , Disp: , Rfl: 0 .  lisinopril-hydrochlorothiazide (PRINZIDE,ZESTORETIC) 10-12.5 MG per tablet, Take 1 tablet by mouth every morning. , Disp: , Rfl:  .  Phentermine-Topiramate  3.75-23 MG CP24, Take 1 capsule by mouth daily., Disp: , Rfl:  .  pravastatin (PRAVACHOL) 40 MG tablet, , Disp: , Rfl: 4 .  Semaglutide,0.25 or 0.5MG/DOS, (OZEMPIC, 0.25 OR 0.5 MG/DOSE,) 2 MG/1.5ML SOPN, Ozempic 0.25 mg or 0.5 mg (2 mg/1.5 mL) subcutaneous pen injector   25 mL every week by sub-q route., Disp: , Rfl:  .  valACYclovir (VALTREX) 500 MG tablet, Take 500 mg by mouth 2 (two) times daily as needed (outbreak). , Disp: , Rfl: 0 .  Dulaglutide (TRULICITY) 1.5 QI/6.9GE SOPN, Inject 1.88m once weekly, Disp: 4 pen, Rfl: 1   No Known Allergies   Review of Systems  Constitutional: Negative.  Negative for fatigue.  Respiratory: Negative.   Cardiovascular: Negative.  Negative for chest pain.  Gastrointestinal: Negative.   Genitourinary: Negative.   Neurological: Negative.   Psychiatric/Behavioral: Negative.      Today's Vitals   11/23/17 1028  BP: (!) 126/98  Pulse: 86  Temp: 97.9 F (36.6 C)  TempSrc: Oral  Weight: 241 lb 12.8 oz (109.7 kg)  Height: 5' 3"  (1.6 m)   Body mass index is 42.83 kg/m.   Objective:  Physical Exam  Constitutional: She is oriented to person, place, and time. She appears well-developed and well-nourished.  HENT:  Head: Normocephalic and atraumatic.  Eyes: EOM are normal.  Cardiovascular: Normal rate, regular rhythm and normal heart sounds.  Pulmonary/Chest: Effort normal and breath  sounds normal.  Musculoskeletal: Normal range of motion.  Neurological: She is alert and oriented to person, place, and time.  Psychiatric: She has a normal mood and affect.  Nursing note and vitals reviewed.       Assessment And Plan:     1. Uncontrolled type 2 diabetes mellitus with hyperglycemia (HCC)  I WILL CHECK LABS AS LISTED BELOW. SHE REQUESTS TO SWITCH TO TRULICITY BECAUSE HER MOM/SISTER ARE ON IT AND THEY HAVE DONE WELL. SHE WAS INSTRUCTED ON HOW TO SELF ADMINISTER THE MEDICATION. SHE WILL START THIS MEDICATION TOMORROW, SHE USUALLY INJECTS ON FRIDAYS.  POSSIBLE SIDE EFFECTS WERE REVIEWED IN FULL DETAIL.   - Hemoglobin A1c - BMP8+EGFR - Lipid Profile  2. Essential hypertension, benign  FAIR CONTROL. SHE WILL CONTINUE WITH CURRENT MEDS. SHE IS ENCOURAGED TO AVOID ADDING SALT TO HER FOODS. IMPORTANCE OF REGULAR EXERCISE WAS ALSO DISCUSSED WITH THE PATIENT.   3. Class 3 severe obesity due to excess calories with serious comorbidity and body mass index (BMI) of 40.0 to 44.9 in adult ([place])  SHE IS ENCOURAGED TO STRIVE FOR BMI LESS THAN 34 TO DECREASE CARDIAC RISK. SHE IS ENCOURAGED TO AVOID SUGARY BEVERAGES INCLUDING DIET DRINKS, JUICES AND SODAS. SHE IS ALSO ENCOURAGED TO EXERCISE 30 MINUTES FOUR TO FIVE DAYS WEEKLY.    Maximino Greenland, MD

## 2017-11-26 ENCOUNTER — Encounter: Payer: Self-pay | Admitting: Internal Medicine

## 2017-11-29 DIAGNOSIS — M1712 Unilateral primary osteoarthritis, left knee: Secondary | ICD-10-CM | POA: Diagnosis not present

## 2017-11-29 DIAGNOSIS — M1711 Unilateral primary osteoarthritis, right knee: Secondary | ICD-10-CM | POA: Diagnosis not present

## 2017-12-02 ENCOUNTER — Other Ambulatory Visit: Payer: Self-pay | Admitting: Internal Medicine

## 2017-12-29 ENCOUNTER — Other Ambulatory Visit: Payer: Self-pay | Admitting: Internal Medicine

## 2018-02-03 ENCOUNTER — Other Ambulatory Visit: Payer: Self-pay | Admitting: Internal Medicine

## 2018-02-26 ENCOUNTER — Ambulatory Visit: Payer: BLUE CROSS/BLUE SHIELD | Admitting: Internal Medicine

## 2018-03-12 DIAGNOSIS — M25562 Pain in left knee: Secondary | ICD-10-CM | POA: Diagnosis not present

## 2018-03-12 DIAGNOSIS — M25561 Pain in right knee: Secondary | ICD-10-CM | POA: Diagnosis not present

## 2018-03-26 DIAGNOSIS — M25561 Pain in right knee: Secondary | ICD-10-CM | POA: Diagnosis not present

## 2018-03-26 DIAGNOSIS — M25562 Pain in left knee: Secondary | ICD-10-CM | POA: Diagnosis not present

## 2018-04-04 DIAGNOSIS — S83242A Other tear of medial meniscus, current injury, left knee, initial encounter: Secondary | ICD-10-CM | POA: Diagnosis not present

## 2018-04-04 DIAGNOSIS — M1711 Unilateral primary osteoarthritis, right knee: Secondary | ICD-10-CM | POA: Diagnosis not present

## 2018-04-04 DIAGNOSIS — S76112A Strain of left quadriceps muscle, fascia and tendon, initial encounter: Secondary | ICD-10-CM | POA: Diagnosis not present

## 2018-04-30 DIAGNOSIS — M25569 Pain in unspecified knee: Secondary | ICD-10-CM | POA: Diagnosis not present

## 2018-05-02 ENCOUNTER — Other Ambulatory Visit: Payer: Self-pay | Admitting: Internal Medicine

## 2018-05-11 DIAGNOSIS — I10 Essential (primary) hypertension: Secondary | ICD-10-CM | POA: Diagnosis not present

## 2018-05-11 DIAGNOSIS — E119 Type 2 diabetes mellitus without complications: Secondary | ICD-10-CM | POA: Diagnosis not present

## 2018-05-11 DIAGNOSIS — E785 Hyperlipidemia, unspecified: Secondary | ICD-10-CM | POA: Diagnosis not present

## 2018-05-11 DIAGNOSIS — Z1331 Encounter for screening for depression: Secondary | ICD-10-CM | POA: Diagnosis not present

## 2018-05-15 DIAGNOSIS — M25569 Pain in unspecified knee: Secondary | ICD-10-CM | POA: Diagnosis not present

## 2018-05-22 DIAGNOSIS — M25569 Pain in unspecified knee: Secondary | ICD-10-CM | POA: Diagnosis not present

## 2018-06-22 DIAGNOSIS — R110 Nausea: Secondary | ICD-10-CM | POA: Diagnosis not present

## 2018-06-22 DIAGNOSIS — E119 Type 2 diabetes mellitus without complications: Secondary | ICD-10-CM | POA: Diagnosis not present

## 2018-06-22 DIAGNOSIS — J029 Acute pharyngitis, unspecified: Secondary | ICD-10-CM | POA: Diagnosis not present

## 2018-06-22 DIAGNOSIS — J309 Allergic rhinitis, unspecified: Secondary | ICD-10-CM | POA: Diagnosis not present

## 2018-07-01 ENCOUNTER — Other Ambulatory Visit: Payer: Self-pay | Admitting: Internal Medicine

## 2018-09-14 DIAGNOSIS — K8020 Calculus of gallbladder without cholecystitis without obstruction: Secondary | ICD-10-CM | POA: Diagnosis not present

## 2018-09-14 DIAGNOSIS — E119 Type 2 diabetes mellitus without complications: Secondary | ICD-10-CM | POA: Diagnosis not present

## 2018-09-14 DIAGNOSIS — E785 Hyperlipidemia, unspecified: Secondary | ICD-10-CM | POA: Diagnosis not present

## 2018-09-17 ENCOUNTER — Other Ambulatory Visit: Payer: Self-pay | Admitting: Endocrinology

## 2018-09-17 DIAGNOSIS — Z1231 Encounter for screening mammogram for malignant neoplasm of breast: Secondary | ICD-10-CM

## 2018-09-19 DIAGNOSIS — E7849 Other hyperlipidemia: Secondary | ICD-10-CM | POA: Diagnosis not present

## 2018-09-19 DIAGNOSIS — D3131 Benign neoplasm of right choroid: Secondary | ICD-10-CM | POA: Diagnosis not present

## 2018-09-19 DIAGNOSIS — E119 Type 2 diabetes mellitus without complications: Secondary | ICD-10-CM | POA: Diagnosis not present

## 2018-09-19 DIAGNOSIS — H02421 Myogenic ptosis of right eyelid: Secondary | ICD-10-CM | POA: Diagnosis not present

## 2018-09-19 DIAGNOSIS — R197 Diarrhea, unspecified: Secondary | ICD-10-CM | POA: Diagnosis not present

## 2018-09-19 DIAGNOSIS — Z23 Encounter for immunization: Secondary | ICD-10-CM | POA: Diagnosis not present

## 2018-09-19 DIAGNOSIS — R110 Nausea: Secondary | ICD-10-CM | POA: Diagnosis not present

## 2018-09-19 LAB — HM DIABETES EYE EXAM

## 2018-09-24 DIAGNOSIS — G43839 Menstrual migraine, intractable, without status migrainosus: Secondary | ICD-10-CM | POA: Diagnosis not present

## 2018-09-24 DIAGNOSIS — G43719 Chronic migraine without aura, intractable, without status migrainosus: Secondary | ICD-10-CM | POA: Diagnosis not present

## 2018-09-24 DIAGNOSIS — Z049 Encounter for examination and observation for unspecified reason: Secondary | ICD-10-CM | POA: Diagnosis not present

## 2018-09-25 ENCOUNTER — Encounter: Payer: Self-pay | Admitting: Internal Medicine

## 2018-09-25 DIAGNOSIS — F411 Generalized anxiety disorder: Secondary | ICD-10-CM | POA: Diagnosis not present

## 2018-09-26 DIAGNOSIS — G43839 Menstrual migraine, intractable, without status migrainosus: Secondary | ICD-10-CM | POA: Diagnosis not present

## 2018-09-26 DIAGNOSIS — M542 Cervicalgia: Secondary | ICD-10-CM | POA: Diagnosis not present

## 2018-09-26 DIAGNOSIS — G43719 Chronic migraine without aura, intractable, without status migrainosus: Secondary | ICD-10-CM | POA: Diagnosis not present

## 2018-10-01 ENCOUNTER — Encounter: Payer: BLUE CROSS/BLUE SHIELD | Admitting: Internal Medicine

## 2018-10-02 DIAGNOSIS — F411 Generalized anxiety disorder: Secondary | ICD-10-CM | POA: Diagnosis not present

## 2018-10-10 DIAGNOSIS — M542 Cervicalgia: Secondary | ICD-10-CM | POA: Diagnosis not present

## 2018-10-10 DIAGNOSIS — G43719 Chronic migraine without aura, intractable, without status migrainosus: Secondary | ICD-10-CM | POA: Diagnosis not present

## 2018-10-10 DIAGNOSIS — G43839 Menstrual migraine, intractable, without status migrainosus: Secondary | ICD-10-CM | POA: Diagnosis not present

## 2018-10-24 DIAGNOSIS — M542 Cervicalgia: Secondary | ICD-10-CM | POA: Diagnosis not present

## 2018-10-24 DIAGNOSIS — G43719 Chronic migraine without aura, intractable, without status migrainosus: Secondary | ICD-10-CM | POA: Diagnosis not present

## 2018-10-24 DIAGNOSIS — G43839 Menstrual migraine, intractable, without status migrainosus: Secondary | ICD-10-CM | POA: Diagnosis not present

## 2018-10-31 ENCOUNTER — Other Ambulatory Visit: Payer: Self-pay

## 2018-10-31 NOTE — Telephone Encounter (Signed)
I left the pt a message to call the office back so that I can schedule her an appt because she hasn't been seen in almost a year.

## 2018-11-01 ENCOUNTER — Other Ambulatory Visit: Payer: Self-pay

## 2018-11-01 ENCOUNTER — Ambulatory Visit
Admission: RE | Admit: 2018-11-01 | Discharge: 2018-11-01 | Disposition: A | Discharge status: Home / Self Care | Payer: BLUE CROSS/BLUE SHIELD | Source: Ambulatory Visit | Attending: Endocrinology | Admitting: Endocrinology

## 2018-11-01 DIAGNOSIS — Z1231 Encounter for screening mammogram for malignant neoplasm of breast: Secondary | ICD-10-CM | POA: Diagnosis not present

## 2018-11-16 DIAGNOSIS — J069 Acute upper respiratory infection, unspecified: Secondary | ICD-10-CM | POA: Diagnosis not present

## 2018-11-16 DIAGNOSIS — J019 Acute sinusitis, unspecified: Secondary | ICD-10-CM | POA: Diagnosis not present

## 2018-11-16 DIAGNOSIS — R05 Cough: Secondary | ICD-10-CM | POA: Diagnosis not present

## 2018-11-28 ENCOUNTER — Other Ambulatory Visit: Payer: Self-pay

## 2018-11-28 DIAGNOSIS — R05 Cough: Secondary | ICD-10-CM | POA: Diagnosis not present

## 2018-11-28 DIAGNOSIS — U071 COVID-19: Secondary | ICD-10-CM | POA: Diagnosis not present

## 2018-11-28 DIAGNOSIS — J181 Lobar pneumonia, unspecified organism: Secondary | ICD-10-CM | POA: Diagnosis not present

## 2018-11-28 DIAGNOSIS — Z20828 Contact with and (suspected) exposure to other viral communicable diseases: Secondary | ICD-10-CM | POA: Diagnosis not present

## 2018-11-28 MED ORDER — VITAMIN D (ERGOCALCIFEROL) 1.25 MG (50000 UNIT) PO CAPS: ORAL_CAPSULE | ORAL | 0 refills | Status: AC

## 2018-12-14 DIAGNOSIS — E785 Hyperlipidemia, unspecified: Secondary | ICD-10-CM | POA: Diagnosis not present

## 2018-12-14 DIAGNOSIS — U071 COVID-19: Secondary | ICD-10-CM | POA: Diagnosis not present

## 2018-12-14 DIAGNOSIS — E119 Type 2 diabetes mellitus without complications: Secondary | ICD-10-CM | POA: Diagnosis not present

## 2018-12-24 DIAGNOSIS — J181 Lobar pneumonia, unspecified organism: Secondary | ICD-10-CM | POA: Diagnosis not present

## 2019-03-10 DIAGNOSIS — E118 Type 2 diabetes mellitus with unspecified complications: Secondary | ICD-10-CM | POA: Diagnosis not present

## 2019-03-28 ENCOUNTER — Ambulatory Visit: Payer: BC Managed Care – PPO | Attending: Internal Medicine

## 2019-03-28 DIAGNOSIS — K8020 Calculus of gallbladder without cholecystitis without obstruction: Secondary | ICD-10-CM | POA: Diagnosis not present

## 2019-03-28 DIAGNOSIS — Z23 Encounter for immunization: Secondary | ICD-10-CM

## 2019-03-28 DIAGNOSIS — I10 Essential (primary) hypertension: Secondary | ICD-10-CM | POA: Diagnosis not present

## 2019-03-28 DIAGNOSIS — E1169 Type 2 diabetes mellitus with other specified complication: Secondary | ICD-10-CM | POA: Diagnosis not present

## 2019-03-28 DIAGNOSIS — E7849 Other hyperlipidemia: Secondary | ICD-10-CM | POA: Diagnosis not present

## 2019-03-28 NOTE — Progress Notes (Signed)
   Covid-19 Vaccination Clinic  Name:  [name]    MRN: [id number] DOB: June 18, 1970  03/28/2019  [name] was observed post Covid-19 immunization for 15 minutes without incident. She was provided with Vaccine Information Sheet and instruction to access the V-Safe system.   [name] was instructed to call 911 with any severe reactions post vaccine: Marland Kitchen Difficulty breathing  . Swelling of face and throat  . A fast heartbeat  . A bad rash all over body  . Dizziness and weakness   Immunizations Administered    Name Date Dose VIS Date Route   Moderna COVID-19 Vaccine 03/28/2019 10:12 AM 0.5 mL 12/04/2018 Intramuscular   Manufacturer: Moderna   Lot: [id number]   [id number]

## 2019-04-11 ENCOUNTER — Ambulatory Visit: Payer: BC Managed Care – PPO | Attending: Internal Medicine

## 2019-04-11 DIAGNOSIS — Z20822 Contact with and (suspected) exposure to covid-19: Secondary | ICD-10-CM

## 2019-04-12 LAB — NOVEL CORONAVIRUS, NAA: SARS-CoV-2, NAA: NOT DETECTED

## 2019-04-12 LAB — SARS-COV-2, NAA 2 DAY TAT

## 2019-04-26 DIAGNOSIS — Z23 Encounter for immunization: Secondary | ICD-10-CM | POA: Diagnosis not present

## 2019-04-30 ENCOUNTER — Ambulatory Visit: Payer: BC Managed Care – PPO

## 2019-05-10 DIAGNOSIS — E118 Type 2 diabetes mellitus with unspecified complications: Secondary | ICD-10-CM | POA: Diagnosis not present

## 2019-07-09 DIAGNOSIS — E1169 Type 2 diabetes mellitus with other specified complication: Secondary | ICD-10-CM | POA: Diagnosis not present

## 2019-07-09 DIAGNOSIS — J309 Allergic rhinitis, unspecified: Secondary | ICD-10-CM | POA: Diagnosis not present

## 2019-07-09 DIAGNOSIS — Z1152 Encounter for screening for COVID-19: Secondary | ICD-10-CM | POA: Diagnosis not present

## 2019-07-09 DIAGNOSIS — J0100 Acute maxillary sinusitis, unspecified: Secondary | ICD-10-CM | POA: Diagnosis not present

## 2019-07-09 DIAGNOSIS — J029 Acute pharyngitis, unspecified: Secondary | ICD-10-CM | POA: Diagnosis not present

## 2019-07-09 DIAGNOSIS — R05 Cough: Secondary | ICD-10-CM | POA: Diagnosis not present

## 2019-07-29 DIAGNOSIS — J309 Allergic rhinitis, unspecified: Secondary | ICD-10-CM | POA: Diagnosis not present

## 2019-07-29 DIAGNOSIS — I10 Essential (primary) hypertension: Secondary | ICD-10-CM | POA: Diagnosis not present

## 2019-07-29 DIAGNOSIS — E1165 Type 2 diabetes mellitus with hyperglycemia: Secondary | ICD-10-CM | POA: Diagnosis not present

## 2019-07-29 DIAGNOSIS — E1169 Type 2 diabetes mellitus with other specified complication: Secondary | ICD-10-CM | POA: Diagnosis not present

## 2019-08-07 ENCOUNTER — Encounter: Payer: Self-pay | Admitting: Gastroenterology

## 2019-09-03 DIAGNOSIS — Z90710 Acquired absence of both cervix and uterus: Secondary | ICD-10-CM | POA: Diagnosis not present

## 2019-09-03 DIAGNOSIS — Z01419 Encounter for gynecological examination (general) (routine) without abnormal findings: Secondary | ICD-10-CM | POA: Diagnosis not present

## 2019-09-03 DIAGNOSIS — Z6841 Body Mass Index (BMI) 40.0 and over, adult: Secondary | ICD-10-CM | POA: Diagnosis not present

## 2019-09-23 DIAGNOSIS — E119 Type 2 diabetes mellitus without complications: Secondary | ICD-10-CM | POA: Diagnosis not present

## 2019-09-23 DIAGNOSIS — H02421 Myogenic ptosis of right eyelid: Secondary | ICD-10-CM | POA: Diagnosis not present

## 2019-09-23 DIAGNOSIS — D3131 Benign neoplasm of right choroid: Secondary | ICD-10-CM | POA: Diagnosis not present

## 2019-09-23 DIAGNOSIS — H2513 Age-related nuclear cataract, bilateral: Secondary | ICD-10-CM | POA: Diagnosis not present

## 2019-09-24 ENCOUNTER — Encounter: Payer: Self-pay | Admitting: Gastroenterology

## 2019-09-24 ENCOUNTER — Ambulatory Visit (AMBULATORY_SURGERY_CENTER): Payer: Self-pay | Admitting: *Deleted

## 2019-09-24 ENCOUNTER — Other Ambulatory Visit: Payer: Self-pay

## 2019-09-24 VITALS — Ht 63.0 in | Wt 235.0 lb

## 2019-09-24 DIAGNOSIS — Z1211 Encounter for screening for malignant neoplasm of colon: Secondary | ICD-10-CM

## 2019-09-24 MED ORDER — SUTAB 1479-225-188 MG PO TABS: 1.0000 | ORAL_TABLET | ORAL | 0 refills | Status: DC

## 2019-09-24 NOTE — Progress Notes (Signed)
Patient is here in-person for PV. Patient denies any allergies to eggs or soy. Patient denies any problems with anesthesia/sedation. Patient denies any oxygen use at home. Patient denies taking any diet/weight loss medications or blood thinners. Patient is not being treated for MRSA or C-diff. Patient is aware of our care-partner policy and [facility]OP[facility]-29 safety protocol. EMMI education assisgned to the patient for the procedure, sent to MyChart.   COVID-19 vaccines completed on April 2021, per patient.   Prep Prescription coupon given to the patient.

## 2019-10-08 ENCOUNTER — Encounter: Payer: Self-pay | Admitting: Gastroenterology

## 2019-10-08 ENCOUNTER — Other Ambulatory Visit: Payer: Self-pay

## 2019-10-08 ENCOUNTER — Ambulatory Visit (AMBULATORY_SURGERY_CENTER): Payer: BC Managed Care – PPO | Admitting: Gastroenterology

## 2019-10-08 VITALS — BP 140/91 | HR 74 | Temp 97.5°F | Resp 19 | Ht 63.0 in | Wt 235.0 lb

## 2019-10-08 DIAGNOSIS — Z1211 Encounter for screening for malignant neoplasm of colon: Secondary | ICD-10-CM | POA: Diagnosis not present

## 2019-10-08 MED ORDER — SODIUM CHLORIDE 0.9 % IV SOLN: 500.0000 mL | Freq: Once | INTRAVENOUS | Status: DC

## 2019-10-08 NOTE — Patient Instructions (Signed)
Handouts provided on diverticulosis and high-fiber diet.   YOU HAD AN ENDOSCOPIC PROCEDURE TODAY AT [facility]:   Refer to the procedure report that was given to you for any specific questions about what was found during the examination.  If the procedure report does not answer your questions, please call your gastroenterologist to clarify.  If you requested that your care partner not be given the details of your procedure findings, then the procedure report has been included in a sealed envelope for you to review at your convenience later.  YOU SHOULD EXPECT: Some feelings of bloating in the abdomen. Passage of more gas than usual.  Walking can help get rid of the air that was put into your GI tract during the procedure and reduce the bloating. If you had a lower endoscopy (such as a colonoscopy or flexible sigmoidoscopy) you may notice spotting of blood in your stool or on the toilet paper. If you underwent a bowel prep for your procedure, you may not have a normal bowel movement for a few days.  Please Note:  You might notice some irritation and congestion in your nose or some drainage.  This is from the oxygen used during your procedure.  There is no need for concern and it should clear up in a day or so.  SYMPTOMS TO REPORT IMMEDIATELY:   Following lower endoscopy (colonoscopy or flexible sigmoidoscopy):  Excessive amounts of blood in the stool  Significant tenderness or worsening of abdominal pains  Swelling of the abdomen that is new, acute  Fever of 100F or higher  For urgent or emergent issues, a gastroenterologist can be reached at any hour by calling [phone]. Do not use MyChart messaging for urgent concerns.    DIET:  We do recommend a small meal at first, but then you may proceed to your regular diet.  Drink plenty of fluids but you should avoid alcoholic beverages for 24 hours.  ACTIVITY:  You should plan to take it easy for the rest of today and you  should NOT DRIVE or use heavy machinery until tomorrow (because of the sedation medicines used during the test).    FOLLOW UP: Our staff will call the number listed on your records 48-72 hours following your procedure to check on you and address any questions or concerns that you may have regarding the information given to you following your procedure. If we do not reach you, we will leave a message.  We will attempt to reach you two times.  During this call, we will ask if you have developed any symptoms of COVID 19. If you develop any symptoms (ie: fever, flu-like symptoms, shortness of breath, cough etc.) before then, please call [phone].  If you test positive for Covid 19 in the 2 weeks post procedure, please call and report this information to [place].    If any biopsies were taken you will be contacted by phone or by letter within the next 1-3 weeks.  Please call us at [phone] if you have not heard about the biopsies in 3 weeks.    SIGNATURES/CONFIDENTIALITY: You and/or your care partner have signed paperwork which will be entered into your electronic medical record.  These signatures attest to the fact that that the information above on your After Visit Summary has been reviewed and is understood.  Full responsibility of the confidentiality of this discharge information lies with you and/or your care-partner.

## 2019-10-08 NOTE — Op Note (Signed)
[place] Patient Name: [name] Procedure Date: 10/08/2019 11:42 AM MRN: [id number] Endoscopist: Thornton Park MD, MD Age: 49 Referring MD:  Date of Birth: Sep 17, 1970 Gender: Female Account #: [id number] Procedure:                Colonoscopy Indications:              Screening for colorectal malignant neoplasm, This                            is the patient's first colonoscopy                           No known family history of colon cancer or polyps Medicines:                Monitored Anesthesia Care Procedure:                Pre-Anesthesia Assessment:                           - Prior to the procedure, a History and Physical                            was performed, and patient medications and                            allergies were reviewed. The patient's tolerance of                            previous anesthesia was also reviewed. The risks                            and benefits of the procedure and the sedation                            options and risks were discussed with the patient.                            All questions were answered, and informed consent                            was obtained. Prior Anticoagulants: The patient has                            taken no previous anticoagulant or antiplatelet                            agents. ASA Grade Assessment: III - A patient with                            severe systemic disease. After reviewing the risks                            and benefits, the patient was deemed in  satisfactory condition to undergo the procedure.                           After obtaining informed consent, the colonoscope                            was passed under direct vision. Throughout the                            procedure, the patient's blood pressure, pulse, and                            oxygen saturations were monitored continuously. The                            Colonoscope was  introduced through the anus and                            advanced to the 3 cm into the ileum. A second                            forward view of the right colon was performed. The                            colonoscopy was performed without difficulty. The                            patient tolerated the procedure well. The quality                            of the bowel preparation was good. The terminal                            ileum, ileocecal valve, appendiceal orifice, and                            rectum were photographed. Scope In: 11:53:22 AM Scope Out: 12:08:27 PM Scope Withdrawal Time: 0 hours 12 minutes 9 seconds  Total Procedure Duration: 0 hours 15 minutes 5 seconds  Findings:                 The perianal and digital rectal examinations were                            normal.                           The entire examined colon appeared normal on direct                            and retroflexion views except for a few small                            diverticulum in the left colon. Complications:  No immediate complications. Estimated Blood Loss:     Estimated blood loss: none. Impression:               - Mild left-sided diverticulosis.                           - No specimens collected. Recommendation:           - Patient has a contact number available for                            emergencies. The signs and symptoms of potential                            delayed complications were discussed with the                            patient. Return to normal activities tomorrow.                            Written discharge instructions were provided to the                            patient.                           - Resume previous diet. High fiber diet recommended.                           - Continue present medications.                           - Repeat colonoscopy in 10 years for surveillance,                            earlier with the development of  any new symptoms.                           - Emerging evidence supports eating a diet of                            fruits, vegetables, grains, calcium, and yogurt                            while reducing red meat and alcohol may reduce the                            risk of colon cancer.                           - Thank you for allowing me to be involved in your                            colon cancer prevention. Thornton Park MD, MD 10/08/2019 12:14:15 PM This report has been signed electronically.

## 2019-10-08 NOTE — Progress Notes (Signed)
VS-CW  Pt's states no medical or surgical changes since previsit or office visit.  

## 2019-10-08 NOTE — Progress Notes (Signed)
pt tolerated well. VSS. awake and to recovery. Report given to RN.  

## 2019-10-10 ENCOUNTER — Telehealth: Payer: Self-pay | Admitting: *Deleted

## 2019-10-10 NOTE — Telephone Encounter (Signed)
  Follow up Call-  Call back number 10/08/2019  Post procedure Call Back phone  # [phone]  Permission to leave phone message Yes  Some recent data might be hidden     Patient questions:  Do you have a fever, pain , or abdominal swelling? No. Pain Score  0 *  Have you tolerated food without any problems? Yes.    Have you been able to return to your normal activities? Yes.    Do you have any questions about your discharge instructions: Diet   No. Medications  No. Follow up visit  No.  Do you have questions or concerns about your Care? No.  Actions: * If pain score is 4 or above: No action needed, pain <4.  1. Have you developed a fever since your procedure? no  2.   Have you had an respiratory symptoms (SOB or cough) since your procedure? no  3.   Have you tested positive for COVID 19 since your procedure no  4.   Have you had any family members/close contacts diagnosed with the COVID 19 since your procedure?  no   If yes to any of these questions please route to Joylene John, RN and Joella Prince, RN

## 2019-10-26 DIAGNOSIS — Z23 Encounter for immunization: Secondary | ICD-10-CM | POA: Diagnosis not present

## 2019-11-13 ENCOUNTER — Other Ambulatory Visit: Payer: Self-pay | Admitting: Endocrinology

## 2019-11-13 DIAGNOSIS — Z0000 Encounter for general adult medical examination without abnormal findings: Secondary | ICD-10-CM

## 2019-11-20 DIAGNOSIS — Z1231 Encounter for screening mammogram for malignant neoplasm of breast: Secondary | ICD-10-CM | POA: Diagnosis not present

## 2019-11-27 DIAGNOSIS — E785 Hyperlipidemia, unspecified: Secondary | ICD-10-CM | POA: Diagnosis not present

## 2019-11-27 DIAGNOSIS — D649 Anemia, unspecified: Secondary | ICD-10-CM | POA: Diagnosis not present

## 2019-11-27 DIAGNOSIS — E1165 Type 2 diabetes mellitus with hyperglycemia: Secondary | ICD-10-CM | POA: Diagnosis not present

## 2019-12-25 ENCOUNTER — Ambulatory Visit: Payer: BC Managed Care – PPO

## 2020-01-09 ENCOUNTER — Other Ambulatory Visit: Payer: BC Managed Care – PPO

## 2020-01-09 DIAGNOSIS — Z20822 Contact with and (suspected) exposure to covid-19: Secondary | ICD-10-CM | POA: Diagnosis not present

## 2020-01-11 LAB — NOVEL CORONAVIRUS, NAA

## 2020-01-29 DIAGNOSIS — N898 Other specified noninflammatory disorders of vagina: Secondary | ICD-10-CM | POA: Diagnosis not present

## 2020-01-29 DIAGNOSIS — A609 Anogenital herpesviral infection, unspecified: Secondary | ICD-10-CM | POA: Diagnosis not present

## 2020-01-29 DIAGNOSIS — N952 Postmenopausal atrophic vaginitis: Secondary | ICD-10-CM | POA: Diagnosis not present

## 2020-03-09 DIAGNOSIS — E118 Type 2 diabetes mellitus with unspecified complications: Secondary | ICD-10-CM | POA: Diagnosis not present

## 2020-03-15 DIAGNOSIS — J039 Acute tonsillitis, unspecified: Secondary | ICD-10-CM | POA: Diagnosis not present

## 2020-04-22 DIAGNOSIS — D649 Anemia, unspecified: Secondary | ICD-10-CM | POA: Diagnosis not present

## 2020-04-22 DIAGNOSIS — E1165 Type 2 diabetes mellitus with hyperglycemia: Secondary | ICD-10-CM | POA: Diagnosis not present

## 2020-04-22 DIAGNOSIS — I10 Essential (primary) hypertension: Secondary | ICD-10-CM | POA: Diagnosis not present

## 2020-07-08 DIAGNOSIS — S83241A Other tear of medial meniscus, current injury, right knee, initial encounter: Secondary | ICD-10-CM | POA: Diagnosis not present

## 2020-07-08 DIAGNOSIS — M25561 Pain in right knee: Secondary | ICD-10-CM | POA: Diagnosis not present

## 2020-07-08 DIAGNOSIS — M1711 Unilateral primary osteoarthritis, right knee: Secondary | ICD-10-CM | POA: Diagnosis not present

## 2020-07-10 ENCOUNTER — Ambulatory Visit (INDEPENDENT_AMBULATORY_CARE_PROVIDER_SITE_OTHER): Payer: BC Managed Care – PPO | Admitting: Neurology

## 2020-07-10 VITALS — BP 145/99 | HR 89 | Ht 63.0 in | Wt 252.0 lb

## 2020-07-10 DIAGNOSIS — H539 Unspecified visual disturbance: Secondary | ICD-10-CM

## 2020-07-10 DIAGNOSIS — G43709 Chronic migraine without aura, not intractable, without status migrainosus: Secondary | ICD-10-CM

## 2020-07-10 DIAGNOSIS — R519 Headache, unspecified: Secondary | ICD-10-CM

## 2020-07-10 DIAGNOSIS — R51 Headache with orthostatic component, not elsewhere classified: Secondary | ICD-10-CM

## 2020-07-10 MED ORDER — GALCANEZUMAB-GNLM 120 MG/ML ~~LOC~~ SOAJ: 240.0000 mg | Freq: Once | SUBCUTANEOUS | Status: AC

## 2020-07-10 MED ORDER — ONDANSETRON 8 MG PO TBDP: 8.0000 mg | ORAL_TABLET | Freq: Three times a day (TID) | ORAL | 11 refills | Status: AC | PRN

## 2020-07-10 MED ORDER — ELETRIPTAN HYDROBROMIDE 40 MG PO TABS: 40.0000 mg | ORAL_TABLET | ORAL | 11 refills | Status: AC | PRN

## 2020-07-10 MED ORDER — EMGALITY 120 MG/ML ~~LOC~~ SOAJ: 120.0000 mg | SUBCUTANEOUS | 11 refills | Status: AC

## 2020-07-10 MED ORDER — NURTEC 75 MG PO TBDP
75.0000 mg | ORAL_TABLET | Freq: Every day | ORAL | 0 refills | Status: AC | PRN
Start: 2020-07-10 — End: ?

## 2020-07-10 NOTE — Patient Instructions (Addendum)
Start CGRP medication, emgality, 1 injection monthly Nurtec daily for the next 2 weeks Recommended MRI brain - will order eletriptan with Ondansetron: Please take one tablet at the onset of your headache. If it does not improve the symptoms please take one additional tablet. Do not take more then 2 tablets in 24hrs. Do not take use more then 2 to 3 times in a week.  Galcanezumab injection What is this medication? GALCANEZUMAB (gal ka NEZ ue mab) is used to prevent migraines and treat clusterheadaches. This medicine may be used for other purposes; ask your health care provider orpharmacist if you have questions. COMMON BRAND NAME(S): Emgality What should I tell my care team before I take this medication? They need to know if you have any of these conditions: an unusual or allergic reaction to galcanezumab, other medicines, foods, dyes, or preservatives pregnant or trying to get pregnant breast-feeding How should I use this medication? This medicine is for injection under the skin. You will be taught how to prepare and give this medicine. Use exactly as directed. Take your medicine atregular intervals. Do not take your medicine more often than directed. It is important that you put your used needles and syringes in a special sharps container. Do not put them in a trash can. If you do not have a sharpscontainer, call your pharmacist or healthcare provider to get one. Talk to your pediatrician regarding the use of this medicine in children.Special care may be needed. Overdosage: If you think you have taken too much of this medicine contact apoison control center or emergency room at once. NOTE: This medicine is only for you. Do not share this medicine with others. What if I miss a dose? If you miss a dose, take it as soon as you can. If it is almost time for yournext dose, take only that dose. Do not take double or extra doses. What may interact with this medication? Interactions are not  expected. This list may not describe all possible interactions. Give your health care provider a list of all the medicines, herbs, non-prescription drugs, or dietary supplements you use. Also tell them if you smoke, drink alcohol, or use illegaldrugs. Some items may interact with your medicine. What should I watch for while using this medication? Tell your doctor or healthcare professional if your symptoms do not start toget better or if they get worse. What side effects may I notice from receiving this medication? Side effects that you should report to your doctor or health care professionalas soon as possible: allergic reactions like skin rash, itching or hives, swelling of the face, lips, or tongue Side effects that usually do not require medical attention (report these toyour doctor or health care professional if they continue or are bothersome): pain, redness, or irritation at site where injected This list may not describe all possible side effects. Call your doctor for medical advice about side effects. You may report side effects to FDA at1-800-FDA-1088. Where should I keep my medication? Keep out of the reach of children. You will be instructed on how to store this medicine. Throw away any unusedmedicine after the expiration date on the label. NOTE: This sheet is a summary. It may not cover all possible information. If you have questions about this medicine, talk to your doctor, pharmacist, orhealth care provider.  2022 Elsevier/Gold Standard (2017-06-07 12:03:23) Eletriptan Tablets What is this medication? ELETRIPTAN (el ih TRIP tan) treats migraines. It works by blocking pain signals and narrowing blood vessels in the brain.  It belongs to a group of medicationscalled triptans. It is not used to prevent migraines. This medicine may be used for other purposes; ask your health care provider orpharmacist if you have questions. COMMON BRAND NAME(S): Relpax What should I tell my care team  before I take this medication? They need to know if you have any of these conditions: Cigarette smoker Circulation problems in fingers and toes Diabetes Heart disease High blood pressure High cholesterol History of irregular heartbeat History of stroke Kidney disease Liver disease Stomach or intestine problems An unusual or allergic reaction to eletriptan, other medications, foods, dyes, or preservatives Pregnant or trying to get pregnant Breast-feeding How should I use this medication? Take this medication by mouth with a glass of water. Follow the directions onthe prescription label. Do not take it more often than directed. Talk to your care team regarding the use of this medication in children.Special care may be needed. Overdosage: If you think you have taken too much of this medicine contact apoison control center or emergency room at once. NOTE: This medicine is only for you. Do not share this medicine with others. What if I miss a dose? This does not apply. This medication is not for regular use. What may interact with this medication? Do not take this medication with any of the following: Ceritinib Certain antibiotics like clarithromycin or telithromycin Certain antivirals for HIV or hepatitis Certain medications for fungal infections like ketoconazole, itraconazole, or posaconazole Chloramphenicol Ergot alkaloids like dihydroergotamine, ergonovine, ergotamine, methylergonovine Idelalisib Mifepristone Nefazodone Other medications for migraine headache like almotriptan, frovatriptan, naratriptan, rizatriptan, sumatriptan, zolmitriptan Ribociclib This medication may also interact with the following: Certain medications for depression, anxiety, or psychotic disorders MAOIs like Carbex, Eldepryl, Marplan, Nardil, and Parnate This list may not describe all possible interactions. Give your health care provider a list of all the medicines, herbs, non-prescription drugs, or  dietary supplements you use. Also tell them if you smoke, drink alcohol, or use illegaldrugs. Some items may interact with your medicine. What should I watch for while using this medication? Visit your care team for regular checks on your progress. Tell your care teamif your symptoms do not start to get better or if they get worse. You may get drowsy or dizzy. Do not drive, use machinery, or do anything that needs mental alertness until you know how this medication affects you. Do not stand up or sit up quickly, especially if you are an older patient. This reduces the risk of dizzy or fainting spells. Alcohol may interfere with theeffect of this medication. Your mouth may get dry. Chewing sugarless gum or sucking hard candy and drinking plenty of water may help. Contact your care team if the problem doesnot go away or is severe. If you take migraine medications for 10 or more days a month, your migraines may get worse. Keep a diary of headache days and medication use. Contact yourcare team if your migraine attacks occur more frequently. What side effects may I notice from receiving this medication? Side effects that you should report to your care team as soon as possible: Allergic reactions-skin rash, itching, hives, swelling of the face, lips, tongue, or throat Burning, pain, tingling, or color changes in the legs or feet Heart attack-pain or tightness in the chest, shoulders, arms, or jaw, nausea, shortness of breath, cold or clammy skin, feeling faint or lightheaded Heart rhythm changes-fast or irregular heartbeat, dizziness, feeling faint or lightheaded, chest pain, trouble breathing Increase in blood pressure Irritability, confusion, fast or  irregular heartbeat, muscle stiffness, twitching muscles, sweating, high fever, seizure, chills, vomiting, diarrhea, which may be signs of serotonin syndrome Raynaud's-cool, numb, or painful fingers or toes that may change color from pale, to blue, to  red Seizures Stroke-sudden numbness or weakness of the face, arm, or leg, trouble speaking, confusion, trouble walking, loss of balance or coordination, dizziness, severe headache, change in vision Sudden or severe stomach pain, nausea, vomiting, fever, or bloody diarrhea Vision loss Side effects that usually do not require medical attention (report to your careteam if they continue or are bothersome): Dizziness General discomfort or fatigue This list may not describe all possible side effects. Call your doctor for medical advice about side effects. You may report side effects to FDA at1-800-FDA-1088. Where should I keep my medication? Keep out of the reach of children and pets. Store at room temperature between 15 and 30 degrees C (59 and 86 degrees F).Throw away any unused medication after the expiration date. NOTE: This sheet is a summary. It may not cover all possible information. If you have questions about this medicine, talk to your doctor, pharmacist, orhealth care provider.  2022 Elsevier/Gold Standard (2020-01-14 16:32:43) Rimegepant oral dissolving tablet What is this medication? RIMEGEPANT (ri ME je pant) is used to treat migraine headaches with or without aura. An aura is a strange feeling or visual disturbance that warns you of anattack. It is also used to prevent migraine headaches. This medicine may be used for other purposes; ask your health care provider orpharmacist if you have questions. COMMON BRAND NAME(S): NURTEC ODT What should I tell my care team before I take this medication? They need to know if you have any of these conditions: kidney disease liver disease an unusual or allergic reaction to rimegepant, other medicines, foods, dyes, or preservatives pregnant or trying to get pregnant breast-feeding How should I use this medication? Take the medicine by mouth. Follow the directions on the prescription label. Leave the tablet in the sealed blister pack until you  are ready to take it. With dry hands, open the blister and gently remove the tablet. If the tablet breaks or crumbles, throw it away and take a new tablet out of the blister pack. Place the tablet in the mouth and allow it to dissolve, and then swallow. Do not cut, crush, or chew this medicine. You do not need water to take thismedicine. Talk to your pediatrician about the use of this medicine in children. Specialcare may be needed. Overdosage: If you think you have taken too much of this medicine contact apoison control center or emergency room at once. NOTE: This medicine is only for you. Do not share this medicine with others. What if I miss a dose? This does not apply. This medicine is not for regular use. What may interact with this medication? This medicine may interact with the following medications: certain medicines for fungal infections like fluconazole, itraconazole rifampin This list may not describe all possible interactions. Give your health care provider a list of all the medicines, herbs, non-prescription drugs, or dietary supplements you use. Also tell them if you smoke, drink alcohol, or use illegaldrugs. Some items may interact with your medicine. What should I watch for while using this medication? Visit your health care professional for regular checks on your progress. Tell your health care professional if your symptoms do not start to get better or ifthey get worse. What side effects may I notice from receiving this medication? Side effects that you should report to  your doctor or health care professionalas soon as possible: allergic reactions like skin rash, itching or hives; swelling of the face, lips, or tongue Side effects that usually do not require medical attention (report these toyour doctor or health care professional if they continue or are bothersome): nausea This list may not describe all possible side effects. Call your doctor for medical advice about side  effects. You may report side effects to FDA at1-800-FDA-1088. Where should I keep my medication? Keep out of the reach of children and pets. Store at room temperature between 20 and 25 degrees C (68 and 77 degrees F).Get rid of any unused medicine after the expiration date. To get rid of medicines that are no longer needed or have expired: Take the medicine to a medicine take-back program. Check with your pharmacy or law enforcement to find a location. If you cannot return the medicine, check the label or package insert to see if the medicine should be thrown out in the garbage or flushed down the toilet. If you are not sure, ask your health care provider. If it is safe to put it in the trash, take the medicine out of the container. Mix the medicine with cat litter, dirt, coffee grounds, or other unwanted substance. Seal the mixture in a bag or container. Put it in the trash. NOTE: This sheet is a summary. It may not cover all possible information. If you have questions about this medicine, talk to your doctor, pharmacist, orhealth care provider.  2022 Elsevier/Gold Standard (2019-06-04 17:56:55)

## 2020-07-10 NOTE — Progress Notes (Signed)
[facility]    Provider:  Dr Jaynee Eagles Requesting Provider: Reynold Bowen, MD Primary Care Provider:  Reynold Bowen, MD  CC: Migraines  HPI:  [name] is a 50 y.o. female here as requested by Reynold Bowen, MD for migraines. PMHX anemia, diabetes, headache, heart murmur, hyperlipidemia, hypertension, obesity, hysterectomy  10 years, they come and go, they are severe, she missed 5 days of work last month because of migraines, they start on the right and radiate to the back of the head, pulsating/pounding/throbbing, photo/phonophobia, severe nausea and she has vomited, movement makes it worse, she can't even bend over to tie her shoes it gets worse, she wants sit still in a dark room, sitting still may help, excedrin migraine helps but the caffeine makes her have palpitations. + morning headache but no known snoring, when she has a headache it is a migraine. 15 migraine days a month that can last up to 72 hours, no aura, no medication overuse, ongoing for >> 6 months at this frequency and severity. Mother had migraines. She also reports vision changes. No other focal neurologic deficits, associated symptoms, inciting events or modifiable factors.  Reviewed notes, labs and imaging from outside physicians, which showed:  From a thorough review of records, medications tried that can be used in migraine management include: Excedrin, Benadryl, ibuprofen, Toradol injections, lisinopril, meloxicam, Zofran injections and oral, topiramate, Phenergan injections, amitriptyline, ubrelvy, imitrex, nerve blocks([facility]), maxalt   Review of Systems: Patient complains of symptoms per HPI as well as the following symptoms migraines. Pertinent negatives and positives per HPI. All others negative.   Social History   Socioeconomic History   Marital status: Divorced    Spouse name: Not on file   Number of children: Not on file   Years of education: Not  on file   Highest education level: Not on file  Occupational History   Not on file  Tobacco Use   Smoking status: Never   Smokeless tobacco: Never  Vaping Use   Vaping Use: Never used  Substance and Sexual Activity   Alcohol use: Yes    Comment: occasional   Drug use: No   Sexual activity: Never    Birth control/protection: Surgical  Other Topics Concern   Not on file  Social History Narrative   Not on file   Social Determinants of Health   Financial Resource Strain: Not on file  Food Insecurity: Not on file  Transportation Needs: Not on file  Physical Activity: Not on file  Stress: Not on file  Social Connections: Not on file  Intimate Partner Violence: Not on file    Family History  Problem Relation Age of Onset   Diabetes Mother    Hypertension Mother    Colon polyps Neg Hx    Colon cancer Neg Hx    Esophageal cancer Neg Hx    Rectal cancer Neg Hx    Stomach cancer Neg Hx     Past Medical History:  Diagnosis Date   Anemia    Diabetes mellitus without complication ([place])    type 2   Fibroids    Headache(784.0)    otc med prn   Heart murmur    History of blood transfusion 03/2013   [place] - 2 units transfued   Hyperlipidemia    Hypertension    Obesity    SVD (spontaneous vaginal delivery)    x 3    Patient Active Problem List   Diagnosis  Date Noted   Chronic migraine without aura without status migrainosus, not intractable 07/13/2020   Uncontrolled type 2 diabetes mellitus with hyperglycemia ([place]) 11/23/2017   Essential hypertension, benign 11/23/2017   S/P repair of ventral hernia 04/02/2014   Ventral hernia 02/12/2014   S/P hysterectomy 05/22/2013   Menorrhagia 03/21/2013   Class 3 severe obesity due to excess calories with serious comorbidity and body mass index (BMI) of 40.0 to 44.9 in adult ([place]) 03/02/2006   MIGRAINE, UNSPEC., W/O INTRACTABLE MIGRAINE 03/02/2006    Past Surgical History:  Procedure Laterality Date   ABDOMINAL HYSTERECTOMY  N/A 05/22/2013   Procedure: HYSTERECTOMY ABDOMINAL LEFT SALPINGECTOMY;  Surgeon: Betsy Coder, MD;  Location: [facility];  Service: Gynecology;  Laterality: N/A;   APPENDECTOMY N/A 04/02/2014   Procedure: APPENDECTOMY;  Surgeon: Johnathan Hausen, MD;  Location: [facility];  Service: General;  Laterality: N/A;   CYSTOSCOPY N/A 05/22/2013   Procedure: CYSTOSCOPY;  Surgeon: Betsy Coder, MD;  Location: [facility];  Service: Gynecology;  Laterality: N/A;   LAPAROSCOPY  05/22/2013   Procedure: LAPAROSCOPY OPERATIVE  LOA;  Surgeon: Betsy Coder, MD;  Location: [facility];  Service: Gynecology;;   LAPAROTOMY N/A 05/22/2013   Procedure: EXPLORATORY LAPAROTOMY REPAIR OF SIGMOID COLON SEROSA ;  Surgeon: Betsy Coder, MD;  Location: [facility];  Service: Gynecology;  Laterality: N/A;   SALPINGECTOMY Right 2005   adb insicion   TUBAL LIGATION     VENTRAL HERNIA REPAIR N/A 04/02/2014   Procedure: LAPAROSCOPIC VENTRAL HERNIA REPAIR;  Surgeon: Johnathan Hausen, MD;  Location: [facility];  Service: General;  Laterality: N/A;    Current Outpatient Medications  Medication Sig Dispense Refill   ACCU-CHEK GUIDE test strip CHECK BS BID  10   aspirin-acetaminophen-caffeine (EXCEDRIN MIGRAINE) 637-858-85 MG per tablet Take 2 tablets by mouth every 6 (six) hours as needed for headache.     B-D UF III MINI PEN NEEDLES 31G X 5 MM MISC USE AS DIRECTED WITH VICTOZA PEN  0   Elderberry 575 MG/5ML SYRP Take by mouth.     eletriptan (RELPAX) 40 MG tablet Take 1 tablet (40 mg total) by mouth as needed for migraine or headache. May repeat in 2 hours if headache persists or recurs. Take first dose with ondansetron 9 tablet 11   Galcanezumab-gnlm (EMGALITY) 120 MG/ML SOAJ Inject 120 mg into the skin every 30 (thirty) days. 1.12 mL 11   lisinopril-hydrochlorothiazide (PRINZIDE,ZESTORETIC) 10-12.5 MG tablet TAKE 1 TABLET BY MOUTH EVERY DAY 90 tablet 0   meloxicam (MOBIC) 7.5 MG tablet Take 7.5 mg by mouth daily.     ondansetron (ZOFRAN-ODT) 8 MG  disintegrating tablet Take 1 tablet (8 mg total) by mouth every 8 (eight) hours as needed for nausea or vomiting. Also take with eletriptan 20 tablet 11   PREMARIN vaginal cream Place vaginally.     Rimegepant Sulfate (NURTEC) 75 MG TBDP Take 75 mg by mouth daily as needed. For migraines. Take as close to onset of migraine as possible. One daily maximum. 16 tablet 0   TRULICITY 1.5 OY/7.7AJ SOPN INJECT CONTENTS OF ONE PEN UNDER THE SKIN ONCE A WEEK AS DIRECTED 4 pen 2   UNABLE TO FIND Med Name: Sea Moss gel 1 tsp daily     valACYclovir (VALTREX) 500 MG tablet Take 500 mg by mouth 2 (two) times daily as needed (outbreak).  0   Vitamin D, Ergocalciferol, (DRISDOL) 1.25 MG (50000 UT) CAPS capsule TAKE 1 CAPSULE BY MOUTH 2 TIMES EVERY  WEEK ON TUESDAY AND FRIDAY 24 capsule 0   zolpidem (AMBIEN) 5 MG tablet Take 5 mg by mouth at bedtime.     Current Facility-Administered Medications  Medication Dose Route Frequency Provider Last Rate Last Admin   Galcanezumab-gnlm SOAJ 240 mg  240 mg Subcutaneous Once Melvenia Beam, MD        Allergies as of 07/10/2020   (No Known Allergies)    Vitals: BP (!) 145/99   Pulse 89   Ht 5\' 3"  (1.6 m)   Wt 252 lb (114.3 kg)   LMP 03/19/2013   BMI 44.64 kg/m  Last Weight:  Wt Readings from Last 1 Encounters:  07/10/20 252 lb (114.3 kg)   Last Height:   Ht Readings from Last 1 Encounters:  07/10/20 5\' 3"  (1.6 m)     Physical exam: Exam: Gen: NAD, conversant, well nourised, obese, well groomed                     CV: RRR, +SEM . No Carotid Bruits. No peripheral edema, warm, nontender Eyes: Conjunctivae clear without exudates or hemorrhage  Neuro: Detailed Neurologic Exam  Speech:    Speech is normal; fluent and spontaneous with normal comprehension.  Cognition:    The patient is oriented to person, place, and time;     recent and remote memory intact;     language fluent;     normal attention, concentration,     fund of knowledge Cranial  Nerves:    The pupils are equal, round, and reactive to light. The fundi are flat. Visual fields are full to finger confrontation. Extraocular movements are intact. Trigeminal sensation is intact and the muscles of mastication are normal. The face is symmetric. The palate elevates in the midline. Hearing intact. Voice is normal. Shoulder shrug is normal. The tongue has normal motion without fasciculations.   Coordination:    Normal finger to nose    Gait:    Slightly wide based due to body habitus  Motor Observation:    No asymmetry, no atrophy, and no involuntary movements noted. Tone:    Normal muscle tone.    Posture:    Posture is normal. normal erect    Strength:    Strength is V/V in the upper and lower limbs.      Sensation: intact to LT     Reflex Exam:  DTR's:    Deep tendon reflexes in the upper and lower extremities are normal bilaterally.   Toes:    The toes are downgoing bilaterally.   Clonus:    Clonus is absent.    Assessment/Plan: This is a patient with chronic migraines however given some concerning symptoms I think that an MRI of the brain is justified.  - Start CGRP medication, emgality, 1 injection monthly - Nurtec daily for the next 2 weeks - Recommended MRI brain - will order - eletriptan with Ondansetron: Please take one tablet at the onset of your headache. If it does not improve the symptoms please take one additional tablet. Do not take more then 2 tablets in 24hrs. Do not take use more then 2 to 3 times in a week. - MRI brain due to concerning symptoms of morning headaches, positional headaches,vision changes  to look for space occupying mass, chiari or intracranial hypertension (pseudotumor).   Orders Placed This Encounter  Procedures   MR BRAIN W WO CONTRAST   Basic Metabolic Panel    Meds ordered this encounter  Medications  Galcanezumab-gnlm SOAJ 240 mg   Galcanezumab-gnlm (EMGALITY) 120 MG/ML SOAJ    Sig: Inject 120 mg into the skin  every 30 (thirty) days.    Dispense:  1.12 mL    Refill:  11   Rimegepant Sulfate (NURTEC) 75 MG TBDP    Sig: Take 75 mg by mouth daily as needed. For migraines. Take as close to onset of migraine as possible. One daily maximum.    Dispense:  16 tablet    Refill:  0    [id number] [id number]   eletriptan (RELPAX) 40 MG tablet    Sig: Take 1 tablet (40 mg total) by mouth as needed for migraine or headache. May repeat in 2 hours if headache persists or recurs. Take first dose with ondansetron    Dispense:  9 tablet    Refill:  11   ondansetron (ZOFRAN-ODT) 8 MG disintegrating tablet    Sig: Take 1 tablet (8 mg total) by mouth every 8 (eight) hours as needed for nausea or vomiting. Also take with eletriptan    Dispense:  20 tablet    Refill:  11     Cc: Reynold Bowen, MD,  Reynold Bowen, MD  Sarina Ill, MD  [facility] [address] [place], [place] [address]  Phone [phone] Fax [phone]

## 2020-07-13 ENCOUNTER — Encounter: Payer: Self-pay | Admitting: Neurology

## 2020-07-13 DIAGNOSIS — G43709 Chronic migraine without aura, not intractable, without status migrainosus: Secondary | ICD-10-CM | POA: Insufficient documentation

## 2020-07-14 ENCOUNTER — Telehealth: Payer: Self-pay | Admitting: *Deleted

## 2020-07-14 NOTE — Telephone Encounter (Signed)
Per Cover My Meds, approved by plan. Effective from 07/14/2020 through 10/05/2020.

## 2020-07-14 NOTE — Telephone Encounter (Signed)
Completed Emgality PA on Cover My Meds. KeyPaulla Leonard - Rx #: [id number]. Awaiting determination from [place]. Spoke with patient. She stated she tried the Amitriptyline for 2 months but then realized it was making her depressed.   Meds tried/failed: Excedrin, Benadryl, ibuprofen, Toradol injections, lisinopril, meloxicam, Zofran injections and oral, topiramate, Phenergan injections, amitriptyline, ubrelvy, imitrex, nerve blocks([facility]), maxalt

## 2020-08-11 ENCOUNTER — Ambulatory Visit: Payer: BLUE CROSS/BLUE SHIELD | Admitting: Neurology

## 2020-08-11 ENCOUNTER — Encounter: Payer: Self-pay | Admitting: *Deleted

## 2020-09-14 DIAGNOSIS — H05243 Constant exophthalmos, bilateral: Secondary | ICD-10-CM | POA: Diagnosis not present

## 2020-09-14 DIAGNOSIS — E785 Hyperlipidemia, unspecified: Secondary | ICD-10-CM | POA: Diagnosis not present

## 2020-09-14 DIAGNOSIS — D649 Anemia, unspecified: Secondary | ICD-10-CM | POA: Diagnosis not present

## 2020-09-14 DIAGNOSIS — E1165 Type 2 diabetes mellitus with hyperglycemia: Secondary | ICD-10-CM | POA: Diagnosis not present

## 2020-09-18 DIAGNOSIS — Z23 Encounter for immunization: Secondary | ICD-10-CM | POA: Diagnosis not present

## 2020-09-25 ENCOUNTER — Other Ambulatory Visit: Payer: Self-pay | Admitting: Endocrinology

## 2020-09-25 DIAGNOSIS — Z1231 Encounter for screening mammogram for malignant neoplasm of breast: Secondary | ICD-10-CM

## 2020-10-12 DIAGNOSIS — F411 Generalized anxiety disorder: Secondary | ICD-10-CM | POA: Diagnosis not present

## 2020-10-15 ENCOUNTER — Telehealth: Payer: BC Managed Care – PPO | Admitting: Neurology

## 2020-10-27 DIAGNOSIS — F411 Generalized anxiety disorder: Secondary | ICD-10-CM | POA: Diagnosis not present

## 2020-11-03 DIAGNOSIS — M5416 Radiculopathy, lumbar region: Secondary | ICD-10-CM | POA: Diagnosis not present

## 2020-11-12 ENCOUNTER — Telehealth: Payer: BC Managed Care – PPO | Admitting: Neurology

## 2020-11-12 DIAGNOSIS — F411 Generalized anxiety disorder: Secondary | ICD-10-CM | POA: Diagnosis not present

## 2020-11-16 DIAGNOSIS — D3131 Benign neoplasm of right choroid: Secondary | ICD-10-CM | POA: Diagnosis not present

## 2020-11-16 DIAGNOSIS — E119 Type 2 diabetes mellitus without complications: Secondary | ICD-10-CM | POA: Diagnosis not present

## 2020-11-16 DIAGNOSIS — H2513 Age-related nuclear cataract, bilateral: Secondary | ICD-10-CM | POA: Diagnosis not present

## 2020-11-16 DIAGNOSIS — H02421 Myogenic ptosis of right eyelid: Secondary | ICD-10-CM | POA: Diagnosis not present

## 2020-11-17 ENCOUNTER — Encounter: Payer: Self-pay | Admitting: Neurology

## 2020-11-23 DIAGNOSIS — R208 Other disturbances of skin sensation: Secondary | ICD-10-CM | POA: Diagnosis not present

## 2020-11-23 DIAGNOSIS — D239 Other benign neoplasm of skin, unspecified: Secondary | ICD-10-CM | POA: Diagnosis not present

## 2020-11-24 DIAGNOSIS — M5416 Radiculopathy, lumbar region: Secondary | ICD-10-CM | POA: Diagnosis not present

## 2020-12-01 ENCOUNTER — Ambulatory Visit
Admission: RE | Admit: 2020-12-01 | Discharge: 2020-12-01 | Disposition: A | Discharge status: Home / Self Care | Payer: BC Managed Care – PPO | Source: Ambulatory Visit | Attending: Endocrinology | Admitting: Endocrinology

## 2020-12-01 DIAGNOSIS — Z1231 Encounter for screening mammogram for malignant neoplasm of breast: Secondary | ICD-10-CM

## 2020-12-10 DIAGNOSIS — F411 Generalized anxiety disorder: Secondary | ICD-10-CM | POA: Diagnosis not present

## 2021-01-07 DIAGNOSIS — F411 Generalized anxiety disorder: Secondary | ICD-10-CM | POA: Diagnosis not present

## 2021-01-22 DIAGNOSIS — E1165 Type 2 diabetes mellitus with hyperglycemia: Secondary | ICD-10-CM | POA: Diagnosis not present

## 2021-01-22 DIAGNOSIS — I10 Essential (primary) hypertension: Secondary | ICD-10-CM | POA: Diagnosis not present

## 2021-02-04 DIAGNOSIS — F411 Generalized anxiety disorder: Secondary | ICD-10-CM | POA: Diagnosis not present

## 2021-02-06 DIAGNOSIS — Z20822 Contact with and (suspected) exposure to covid-19: Secondary | ICD-10-CM | POA: Diagnosis not present

## 2021-02-18 DIAGNOSIS — F411 Generalized anxiety disorder: Secondary | ICD-10-CM | POA: Diagnosis not present

## 2021-02-26 DIAGNOSIS — M25572 Pain in left ankle and joints of left foot: Secondary | ICD-10-CM | POA: Diagnosis not present

## 2021-02-26 DIAGNOSIS — M25571 Pain in right ankle and joints of right foot: Secondary | ICD-10-CM | POA: Diagnosis not present

## 2021-03-04 DIAGNOSIS — F411 Generalized anxiety disorder: Secondary | ICD-10-CM | POA: Diagnosis not present

## 2021-03-12 DIAGNOSIS — M25572 Pain in left ankle and joints of left foot: Secondary | ICD-10-CM | POA: Diagnosis not present

## 2021-03-12 DIAGNOSIS — M25571 Pain in right ankle and joints of right foot: Secondary | ICD-10-CM | POA: Diagnosis not present

## 2021-03-18 DIAGNOSIS — F411 Generalized anxiety disorder: Secondary | ICD-10-CM | POA: Diagnosis not present

## 2021-04-01 DIAGNOSIS — F411 Generalized anxiety disorder: Secondary | ICD-10-CM | POA: Diagnosis not present

## 2021-04-15 DIAGNOSIS — F411 Generalized anxiety disorder: Secondary | ICD-10-CM | POA: Diagnosis not present

## 2021-04-29 DIAGNOSIS — F411 Generalized anxiety disorder: Secondary | ICD-10-CM | POA: Diagnosis not present

## 2021-05-12 DIAGNOSIS — E1165 Type 2 diabetes mellitus with hyperglycemia: Secondary | ICD-10-CM | POA: Diagnosis not present

## 2021-05-27 DIAGNOSIS — F411 Generalized anxiety disorder: Secondary | ICD-10-CM | POA: Diagnosis not present

## 2021-06-10 DIAGNOSIS — F411 Generalized anxiety disorder: Secondary | ICD-10-CM | POA: Diagnosis not present

## 2021-07-12 DIAGNOSIS — J029 Acute pharyngitis, unspecified: Secondary | ICD-10-CM | POA: Diagnosis not present

## 2021-07-12 DIAGNOSIS — U071 COVID-19: Secondary | ICD-10-CM | POA: Diagnosis not present

## 2021-07-12 DIAGNOSIS — L049 Acute lymphadenitis, unspecified: Secondary | ICD-10-CM | POA: Diagnosis not present

## 2021-07-23 DIAGNOSIS — F411 Generalized anxiety disorder: Secondary | ICD-10-CM | POA: Diagnosis not present

## 2021-08-05 DIAGNOSIS — F411 Generalized anxiety disorder: Secondary | ICD-10-CM | POA: Diagnosis not present

## 2021-08-18 DIAGNOSIS — Z01419 Encounter for gynecological examination (general) (routine) without abnormal findings: Secondary | ICD-10-CM | POA: Diagnosis not present

## 2021-08-18 DIAGNOSIS — Z113 Encounter for screening for infections with a predominantly sexual mode of transmission: Secondary | ICD-10-CM | POA: Diagnosis not present

## 2021-08-18 DIAGNOSIS — Z114 Encounter for screening for human immunodeficiency virus [HIV]: Secondary | ICD-10-CM | POA: Diagnosis not present

## 2021-08-18 DIAGNOSIS — Z1159 Encounter for screening for other viral diseases: Secondary | ICD-10-CM | POA: Diagnosis not present

## 2021-08-18 DIAGNOSIS — Z118 Encounter for screening for other infectious and parasitic diseases: Secondary | ICD-10-CM | POA: Diagnosis not present

## 2021-08-18 DIAGNOSIS — Z6841 Body Mass Index (BMI) 40.0 and over, adult: Secondary | ICD-10-CM | POA: Diagnosis not present

## 2021-08-19 DIAGNOSIS — F411 Generalized anxiety disorder: Secondary | ICD-10-CM | POA: Diagnosis not present

## 2021-09-02 DIAGNOSIS — F411 Generalized anxiety disorder: Secondary | ICD-10-CM | POA: Diagnosis not present

## 2021-11-08 ENCOUNTER — Other Ambulatory Visit: Payer: Self-pay | Admitting: Endocrinology

## 2021-11-08 DIAGNOSIS — Z1231 Encounter for screening mammogram for malignant neoplasm of breast: Secondary | ICD-10-CM

## 2021-11-17 DIAGNOSIS — H2513 Age-related nuclear cataract, bilateral: Secondary | ICD-10-CM | POA: Diagnosis not present

## 2021-11-17 DIAGNOSIS — E119 Type 2 diabetes mellitus without complications: Secondary | ICD-10-CM | POA: Diagnosis not present

## 2021-11-17 DIAGNOSIS — H02421 Myogenic ptosis of right eyelid: Secondary | ICD-10-CM | POA: Diagnosis not present

## 2021-11-17 DIAGNOSIS — D3131 Benign neoplasm of right choroid: Secondary | ICD-10-CM | POA: Diagnosis not present

## 2022-01-06 ENCOUNTER — Ambulatory Visit
Admission: RE | Admit: 2022-01-06 | Discharge: 2022-01-06 | Disposition: A | Discharge status: Home / Self Care | Payer: BC Managed Care – PPO | Source: Ambulatory Visit | Attending: Endocrinology | Admitting: Endocrinology

## 2022-01-06 DIAGNOSIS — Z1231 Encounter for screening mammogram for malignant neoplasm of breast: Secondary | ICD-10-CM

## 2022-03-18 DIAGNOSIS — E785 Hyperlipidemia, unspecified: Secondary | ICD-10-CM | POA: Diagnosis not present

## 2022-03-18 DIAGNOSIS — E1165 Type 2 diabetes mellitus with hyperglycemia: Secondary | ICD-10-CM | POA: Diagnosis not present

## 2022-03-18 DIAGNOSIS — I10 Essential (primary) hypertension: Secondary | ICD-10-CM | POA: Diagnosis not present

## 2022-03-18 DIAGNOSIS — F419 Anxiety disorder, unspecified: Secondary | ICD-10-CM | POA: Diagnosis not present

## 2022-07-11 DIAGNOSIS — I10 Essential (primary) hypertension: Secondary | ICD-10-CM | POA: Diagnosis not present

## 2022-07-11 DIAGNOSIS — E1165 Type 2 diabetes mellitus with hyperglycemia: Secondary | ICD-10-CM | POA: Diagnosis not present

## 2022-08-17 DIAGNOSIS — L680 Hirsutism: Secondary | ICD-10-CM | POA: Diagnosis not present

## 2022-08-17 DIAGNOSIS — N950 Postmenopausal bleeding: Secondary | ICD-10-CM | POA: Diagnosis not present

## 2022-08-17 DIAGNOSIS — R300 Dysuria: Secondary | ICD-10-CM | POA: Diagnosis not present

## 2022-08-17 DIAGNOSIS — N760 Acute vaginitis: Secondary | ICD-10-CM | POA: Diagnosis not present

## 2022-08-17 DIAGNOSIS — R102 Pelvic and perineal pain: Secondary | ICD-10-CM | POA: Diagnosis not present

## 2022-09-09 DIAGNOSIS — M545 Low back pain, unspecified: Secondary | ICD-10-CM | POA: Diagnosis not present

## 2022-09-09 DIAGNOSIS — M5416 Radiculopathy, lumbar region: Secondary | ICD-10-CM | POA: Diagnosis not present

## 2022-09-27 DIAGNOSIS — M5416 Radiculopathy, lumbar region: Secondary | ICD-10-CM | POA: Diagnosis not present

## 2022-10-11 DIAGNOSIS — Z01419 Encounter for gynecological examination (general) (routine) without abnormal findings: Secondary | ICD-10-CM | POA: Diagnosis not present

## 2022-10-11 DIAGNOSIS — Z1331 Encounter for screening for depression: Secondary | ICD-10-CM | POA: Diagnosis not present

## 2022-11-17 DIAGNOSIS — E1165 Type 2 diabetes mellitus with hyperglycemia: Secondary | ICD-10-CM | POA: Diagnosis not present

## 2022-11-17 DIAGNOSIS — Z23 Encounter for immunization: Secondary | ICD-10-CM | POA: Diagnosis not present

## 2022-11-21 DIAGNOSIS — H02421 Myogenic ptosis of right eyelid: Secondary | ICD-10-CM | POA: Diagnosis not present

## 2022-11-21 DIAGNOSIS — D3131 Benign neoplasm of right choroid: Secondary | ICD-10-CM | POA: Diagnosis not present

## 2022-11-21 DIAGNOSIS — H2513 Age-related nuclear cataract, bilateral: Secondary | ICD-10-CM | POA: Diagnosis not present

## 2022-11-21 DIAGNOSIS — E119 Type 2 diabetes mellitus without complications: Secondary | ICD-10-CM | POA: Diagnosis not present

## 2023-01-17 DIAGNOSIS — Z1389 Encounter for screening for other disorder: Secondary | ICD-10-CM | POA: Diagnosis not present

## 2023-01-17 DIAGNOSIS — R110 Nausea: Secondary | ICD-10-CM | POA: Diagnosis not present

## 2023-01-17 DIAGNOSIS — R109 Unspecified abdominal pain: Secondary | ICD-10-CM | POA: Diagnosis not present

## 2023-01-26 DIAGNOSIS — K8020 Calculus of gallbladder without cholecystitis without obstruction: Secondary | ICD-10-CM | POA: Diagnosis not present

## 2023-03-15 DIAGNOSIS — J3489 Other specified disorders of nose and nasal sinuses: Secondary | ICD-10-CM | POA: Diagnosis not present

## 2023-03-15 DIAGNOSIS — U071 COVID-19: Secondary | ICD-10-CM | POA: Diagnosis not present

## 2023-04-13 DIAGNOSIS — I10 Essential (primary) hypertension: Secondary | ICD-10-CM | POA: Diagnosis not present

## 2023-04-13 DIAGNOSIS — E1165 Type 2 diabetes mellitus with hyperglycemia: Secondary | ICD-10-CM | POA: Diagnosis not present

## 2023-05-22 DIAGNOSIS — M5416 Radiculopathy, lumbar region: Secondary | ICD-10-CM | POA: Diagnosis not present

## 2023-05-22 DIAGNOSIS — M5442 Lumbago with sciatica, left side: Secondary | ICD-10-CM | POA: Diagnosis not present

## 2023-05-22 DIAGNOSIS — M5441 Lumbago with sciatica, right side: Secondary | ICD-10-CM | POA: Diagnosis not present

## 2023-05-22 DIAGNOSIS — M5459 Other low back pain: Secondary | ICD-10-CM | POA: Diagnosis not present

## 2023-05-25 ENCOUNTER — Other Ambulatory Visit (HOSPITAL_BASED_OUTPATIENT_CLINIC_OR_DEPARTMENT_OTHER): Payer: Self-pay | Admitting: Family Medicine

## 2023-05-25 DIAGNOSIS — M5459 Other low back pain: Secondary | ICD-10-CM

## 2023-05-26 ENCOUNTER — Ambulatory Visit (HOSPITAL_BASED_OUTPATIENT_CLINIC_OR_DEPARTMENT_OTHER)
Admission: RE | Admit: 2023-05-26 | Discharge: 2023-05-26 | Disposition: A | Discharge status: Home / Self Care | Source: Ambulatory Visit | Attending: Family Medicine | Admitting: Family Medicine

## 2023-05-26 DIAGNOSIS — M5459 Other low back pain: Secondary | ICD-10-CM | POA: Diagnosis not present

## 2023-05-26 DIAGNOSIS — M4317 Spondylolisthesis, lumbosacral region: Secondary | ICD-10-CM | POA: Diagnosis not present

## 2023-05-26 DIAGNOSIS — M5136 Other intervertebral disc degeneration, lumbar region with discogenic back pain only: Secondary | ICD-10-CM | POA: Diagnosis not present

## 2023-05-26 DIAGNOSIS — M5126 Other intervertebral disc displacement, lumbar region: Secondary | ICD-10-CM | POA: Diagnosis not present

## 2023-05-26 DIAGNOSIS — M48061 Spinal stenosis, lumbar region without neurogenic claudication: Secondary | ICD-10-CM | POA: Diagnosis not present

## 2023-06-03 DIAGNOSIS — M5416 Radiculopathy, lumbar region: Secondary | ICD-10-CM | POA: Diagnosis not present

## 2023-06-22 DIAGNOSIS — M51369 Other intervertebral disc degeneration, lumbar region without mention of lumbar back pain or lower extremity pain: Secondary | ICD-10-CM | POA: Diagnosis not present

## 2023-06-26 DIAGNOSIS — Z23 Encounter for immunization: Secondary | ICD-10-CM | POA: Diagnosis not present

## 2023-06-26 DIAGNOSIS — W268XXA Contact with other sharp object(s), not elsewhere classified, initial encounter: Secondary | ICD-10-CM | POA: Diagnosis not present

## 2023-06-26 DIAGNOSIS — S61011A Laceration without foreign body of right thumb without damage to nail, initial encounter: Secondary | ICD-10-CM | POA: Diagnosis not present

## 2023-08-23 DIAGNOSIS — E1165 Type 2 diabetes mellitus with hyperglycemia: Secondary | ICD-10-CM | POA: Diagnosis not present

## 2023-09-06 DIAGNOSIS — H0011 Chalazion right upper eyelid: Secondary | ICD-10-CM | POA: Diagnosis not present

## 2023-09-06 DIAGNOSIS — H5213 Myopia, bilateral: Secondary | ICD-10-CM | POA: Diagnosis not present

## 2023-09-06 DIAGNOSIS — H524 Presbyopia: Secondary | ICD-10-CM | POA: Diagnosis not present

## 2023-09-06 DIAGNOSIS — H52223 Regular astigmatism, bilateral: Secondary | ICD-10-CM | POA: Diagnosis not present

## 2023-09-13 ENCOUNTER — Other Ambulatory Visit: Payer: Self-pay | Admitting: Endocrinology

## 2023-09-13 DIAGNOSIS — Z1231 Encounter for screening mammogram for malignant neoplasm of breast: Secondary | ICD-10-CM

## 2023-10-06 ENCOUNTER — Ambulatory Visit
Admission: RE | Admit: 2023-10-06 | Discharge: 2023-10-06 | Disposition: A | Discharge status: Home / Self Care | Source: Ambulatory Visit | Attending: Endocrinology | Admitting: Endocrinology

## 2023-10-06 DIAGNOSIS — Z1231 Encounter for screening mammogram for malignant neoplasm of breast: Secondary | ICD-10-CM | POA: Diagnosis not present

## 2023-10-17 DIAGNOSIS — H0011 Chalazion right upper eyelid: Secondary | ICD-10-CM | POA: Diagnosis not present

## 2023-11-09 DIAGNOSIS — F900 Attention-deficit hyperactivity disorder, predominantly inattentive type: Secondary | ICD-10-CM | POA: Diagnosis not present

## 2023-11-09 DIAGNOSIS — F411 Generalized anxiety disorder: Secondary | ICD-10-CM | POA: Diagnosis not present

## 2023-11-22 DIAGNOSIS — F900 Attention-deficit hyperactivity disorder, predominantly inattentive type: Secondary | ICD-10-CM | POA: Diagnosis not present

## 2023-11-22 DIAGNOSIS — F411 Generalized anxiety disorder: Secondary | ICD-10-CM | POA: Diagnosis not present

## 2023-11-27 DIAGNOSIS — H02421 Myogenic ptosis of right eyelid: Secondary | ICD-10-CM | POA: Diagnosis not present

## 2023-11-27 DIAGNOSIS — H2513 Age-related nuclear cataract, bilateral: Secondary | ICD-10-CM | POA: Diagnosis not present

## 2023-11-27 DIAGNOSIS — E119 Type 2 diabetes mellitus without complications: Secondary | ICD-10-CM | POA: Diagnosis not present

## 2023-11-27 DIAGNOSIS — D3131 Benign neoplasm of right choroid: Secondary | ICD-10-CM | POA: Diagnosis not present

## 2023-12-04 DIAGNOSIS — F900 Attention-deficit hyperactivity disorder, predominantly inattentive type: Secondary | ICD-10-CM | POA: Diagnosis not present

## 2023-12-06 DIAGNOSIS — D649 Anemia, unspecified: Secondary | ICD-10-CM | POA: Diagnosis not present

## 2023-12-11 DIAGNOSIS — F411 Generalized anxiety disorder: Secondary | ICD-10-CM | POA: Diagnosis not present

## 2023-12-11 DIAGNOSIS — F900 Attention-deficit hyperactivity disorder, predominantly inattentive type: Secondary | ICD-10-CM | POA: Diagnosis not present

## 2023-12-12 DIAGNOSIS — Z1212 Encounter for screening for malignant neoplasm of rectum: Secondary | ICD-10-CM | POA: Diagnosis not present

## 2023-12-12 DIAGNOSIS — F900 Attention-deficit hyperactivity disorder, predominantly inattentive type: Secondary | ICD-10-CM | POA: Diagnosis not present

## 2023-12-13 DIAGNOSIS — R82998 Other abnormal findings in urine: Secondary | ICD-10-CM | POA: Diagnosis not present

## 2023-12-13 DIAGNOSIS — I10 Essential (primary) hypertension: Secondary | ICD-10-CM | POA: Diagnosis not present

## 2023-12-13 DIAGNOSIS — E1165 Type 2 diabetes mellitus with hyperglycemia: Secondary | ICD-10-CM | POA: Diagnosis not present

## 2024-01-08 ENCOUNTER — Ambulatory Visit
